# Patient Record
Sex: Female | Born: 1945
Health system: Southern US, Community
[De-identification: ages and names within clinical notes are randomized; demographics above are authoritative.]

## PROBLEM LIST (undated history)

## (undated) DIAGNOSIS — L409 Psoriasis, unspecified: Secondary | ICD-10-CM

## (undated) DIAGNOSIS — I471 Supraventricular tachycardia, unspecified: Secondary | ICD-10-CM

## (undated) DIAGNOSIS — A4901 Methicillin susceptible Staphylococcus aureus infection, unspecified site: Secondary | ICD-10-CM

## (undated) DIAGNOSIS — R945 Abnormal results of liver function studies: Secondary | ICD-10-CM

## (undated) DIAGNOSIS — I10 Essential (primary) hypertension: Secondary | ICD-10-CM

## (undated) DIAGNOSIS — K219 Gastro-esophageal reflux disease without esophagitis: Secondary | ICD-10-CM

## (undated) DIAGNOSIS — M069 Rheumatoid arthritis, unspecified: Secondary | ICD-10-CM

## (undated) DIAGNOSIS — R7989 Other specified abnormal findings of blood chemistry: Secondary | ICD-10-CM

## (undated) DIAGNOSIS — M8580 Other specified disorders of bone density and structure, unspecified site: Secondary | ICD-10-CM

## (undated) DIAGNOSIS — IMO0002 Reserved for concepts with insufficient information to code with codable children: Secondary | ICD-10-CM

## (undated) DIAGNOSIS — K760 Fatty (change of) liver, not elsewhere classified: Secondary | ICD-10-CM

## (undated) DIAGNOSIS — E669 Obesity, unspecified: Secondary | ICD-10-CM

## (undated) DIAGNOSIS — I493 Ventricular premature depolarization: Secondary | ICD-10-CM

## (undated) DIAGNOSIS — R002 Palpitations: Secondary | ICD-10-CM

## (undated) DIAGNOSIS — K759 Inflammatory liver disease, unspecified: Secondary | ICD-10-CM

## (undated) HISTORY — DX: Gastro-esophageal reflux disease without esophagitis: K21.9

## (undated) HISTORY — DX: Inflammatory liver disease, unspecified: K75.9

## (undated) HISTORY — DX: Abnormal results of liver function studies: R94.5

## (undated) HISTORY — DX: Essential (primary) hypertension: I10

## (undated) HISTORY — DX: Supraventricular tachycardia, unspecified: I47.10

## (undated) HISTORY — PX: FACIAL RECONSTRUCTION SURGERY: SHX631

## (undated) HISTORY — DX: Reserved for concepts with insufficient information to code with codable children: IMO0002

## (undated) HISTORY — DX: Psoriasis, unspecified: L40.9

## (undated) HISTORY — DX: Palpitations: R00.2

## (undated) HISTORY — DX: Methicillin susceptible Staphylococcus aureus infection, unspecified site: A49.01

## (undated) HISTORY — DX: Ventricular premature depolarization: I49.3

## (undated) HISTORY — DX: Rheumatoid arthritis, unspecified: M06.9

## (undated) HISTORY — DX: Obesity, unspecified: E66.9

## (undated) HISTORY — DX: Nonrheumatic aortic (valve) stenosis: I35.0

## (undated) HISTORY — DX: Fatty (change of) liver, not elsewhere classified: K76.0

## (undated) HISTORY — DX: Other specified disorders of bone density and structure, unspecified site: M85.80

## (undated) HISTORY — DX: Other specified abnormal findings of blood chemistry: R79.89

## (undated) HISTORY — DX: Supraventricular tachycardia: I47.1

## (undated) HISTORY — DX: Atherosclerotic heart disease of native coronary artery without angina pectoris: I25.10

---

## 1976-10-11 HISTORY — PX: OVARIAN CYST REMOVAL: SHX89

## 1996-10-11 HISTORY — PX: APPENDECTOMY: SHX54

## 1998-12-09 ENCOUNTER — Encounter: Payer: Self-pay | Admitting: Internal Medicine

## 1998-12-09 ENCOUNTER — Ambulatory Visit (HOSPITAL_COMMUNITY): Admission: RE | Admit: 1998-12-09 | Discharge: 1998-12-09 | Payer: Self-pay | Admitting: Internal Medicine

## 1999-01-07 ENCOUNTER — Other Ambulatory Visit: Admission: RE | Admit: 1999-01-07 | Discharge: 1999-01-07 | Payer: Self-pay | Admitting: *Deleted

## 1999-07-28 ENCOUNTER — Other Ambulatory Visit: Admission: RE | Admit: 1999-07-28 | Discharge: 1999-07-28 | Payer: Self-pay | Admitting: *Deleted

## 2000-08-23 ENCOUNTER — Other Ambulatory Visit: Admission: RE | Admit: 2000-08-23 | Discharge: 2000-08-23 | Payer: Self-pay | Admitting: *Deleted

## 2001-10-11 HISTORY — PX: HERNIA REPAIR: SHX51

## 2001-10-11 HISTORY — PX: OTHER SURGICAL HISTORY: SHX169

## 2004-10-19 ENCOUNTER — Other Ambulatory Visit: Admission: RE | Admit: 2004-10-19 | Discharge: 2004-10-19 | Payer: Self-pay | Admitting: Family Medicine

## 2005-10-11 DIAGNOSIS — A4901 Methicillin susceptible Staphylococcus aureus infection, unspecified site: Secondary | ICD-10-CM

## 2005-10-11 HISTORY — DX: Methicillin susceptible Staphylococcus aureus infection, unspecified site: A49.01

## 2008-07-04 ENCOUNTER — Encounter: Payer: Self-pay | Admitting: Women's Health

## 2008-07-04 ENCOUNTER — Other Ambulatory Visit: Admission: RE | Admit: 2008-07-04 | Discharge: 2008-07-04 | Payer: Self-pay | Admitting: Gynecology

## 2011-05-06 ENCOUNTER — Encounter: Payer: Self-pay | Admitting: *Deleted

## 2011-05-07 ENCOUNTER — Other Ambulatory Visit (HOSPITAL_COMMUNITY)
Admission: RE | Admit: 2011-05-07 | Discharge: 2011-05-07 | Disposition: A | Discharge status: Home / Self Care | Payer: Medicare Other | Source: Ambulatory Visit | Attending: Women's Health | Admitting: Women's Health

## 2011-05-07 ENCOUNTER — Ambulatory Visit (INDEPENDENT_AMBULATORY_CARE_PROVIDER_SITE_OTHER): Payer: Medicare Other | Admitting: Women's Health

## 2011-05-07 ENCOUNTER — Encounter: Payer: Self-pay | Admitting: Women's Health

## 2011-05-07 VITALS — BP 140/90 | Ht 64.0 in | Wt 193.0 lb

## 2011-05-07 DIAGNOSIS — Z01419 Encounter for gynecological examination (general) (routine) without abnormal findings: Secondary | ICD-10-CM

## 2011-05-07 DIAGNOSIS — Z124 Encounter for screening for malignant neoplasm of cervix: Secondary | ICD-10-CM

## 2011-05-07 NOTE — Progress Notes (Signed)
Madison Gutierrez 06/23/1946 [id number]    History:    The patient presents for annual exam. 65 year old MWF G4 P3 postmenopausal with no bleeding. No HRT. Problem with psoriasis which has caused problems with exercise,and has had wt gain. She is hypertensive and has her labs and meds done at primary care.   Past medical history, past surgical history, family history and social history were all reviewed and documented in the EPIC chart.   ROS:  A 14 point ROS was performed and pertinent positives and negatives are included in the history.  Exam:  Filed Vitals:   05/07/11 1108  BP: 140/90    General appearance:  Normal Head/Neck:  Normal, without cervical or supraclavicular adenopathy. Thyroid:  Symmetrical, normal in size, without palpable masses or nodularity. Respiratory  Effort:  Normal  Auscultation:  Clear without wheezing or rhonchi Cardiovascular  Auscultation:  Regular rate, without rubs, murmurs or gallops  Edema/varicosities:  Not grossly evident Abdominal  Masses/tenderness:  Soft,nontender, without masses, guarding or rebound.  Liver/spleen:  No organomegaly noted  Hernia:  None appreciated  Occult test:   Skin  Inspection:  Grossly normal  Palpation:  Grossly normal Neurologic/psychiatric  Orientation:  Normal with appropriate conversation.  Mood/affect:  Normal  Genitourinary     Breasts: Examined lying and sitting.     Right: Without masses, retractions, discharge or axillary adenopathy.     Left: Without masses, retractions, discharge or axillary adenopathy.   Inguinal/mons:  Normal without inguinal adenopathy  External genitalia:  Normal  BUS/Urethra/Skene's glands:  Normal  Bladder:  Normal  Vagina:  Normal  Cervix:  Normal  Uterus:  normal in size, shape and contour.  Midline and mobile  Adnexa/parametria:     Rt: Without masses or tenderness.   Lt: Without masses or tenderness.  Anus and perineum: Normal  Digital rectal exam: Normal sphincter  tone without palpated masses or  tenderness.   Assessment/Plan:  65 y.o. year old female for annual exam.Pap Only today. Reviewed needed vaccines of zostavac, Pneumovax, , will get at [facility]. She is overdue for mammogram. She will get that scheduled she has a history of normal mammograms in the past,SBE's encouraged. She has not had a bone density in years we'll get that scheduled here at our office. Reviewed importance of the calcium rich diet, vitamin D 2000 daily encouraged. She has never had a colonoscopy and reviewed importance of a screening colonoscopy she states she is in the process of getting that  Scheduled.  He has problems with psoriasis of her feet causing some mobility problems, she does have plans to start using a stationary bike.Home safety, fall prevention discussed.     Harrington Challenger MD, 12:04 PM 05/07/2011

## 2011-05-07 NOTE — Patient Instructions (Signed)
Schedule mammogram  And have yearly Bone density here Colonoscopy  Schedule Tdap, zostovac (shingles), pnuemonia  Vaccine  Exercise - Walk daily

## 2011-05-12 DIAGNOSIS — M8580 Other specified disorders of bone density and structure, unspecified site: Secondary | ICD-10-CM

## 2011-05-12 HISTORY — DX: Other specified disorders of bone density and structure, unspecified site: M85.80

## 2011-05-19 DIAGNOSIS — R7989 Other specified abnormal findings of blood chemistry: Secondary | ICD-10-CM

## 2011-05-19 HISTORY — DX: Other specified abnormal findings of blood chemistry: R79.89

## 2011-05-20 ENCOUNTER — Encounter: Payer: Self-pay | Admitting: Gynecology

## 2011-05-20 ENCOUNTER — Ambulatory Visit (INDEPENDENT_AMBULATORY_CARE_PROVIDER_SITE_OTHER): Payer: Medicare Other | Admitting: Gynecology

## 2011-05-20 ENCOUNTER — Telehealth: Payer: Self-pay | Admitting: Gynecology

## 2011-05-20 DIAGNOSIS — M899 Disorder of bone, unspecified: Secondary | ICD-10-CM

## 2011-05-20 DIAGNOSIS — Z01419 Encounter for gynecological examination (general) (routine) without abnormal findings: Secondary | ICD-10-CM

## 2011-05-20 DIAGNOSIS — M8580 Other specified disorders of bone density and structure, unspecified site: Secondary | ICD-10-CM

## 2011-05-20 NOTE — Telephone Encounter (Signed)
LM FOR PT TO CALL RE:BELOW NOTE. 

## 2011-05-20 NOTE — Telephone Encounter (Signed)
Call patient told her bone density did show osteopenia. I want to check a vitamin D level and I put this in and then have her make an appointment with me to discuss the results.

## 2011-05-21 NOTE — Telephone Encounter (Signed)
PT INFORMED WITH THE BELOW NOTE. 

## 2011-05-24 ENCOUNTER — Other Ambulatory Visit: Payer: Medicare Other | Admitting: Gynecology

## 2011-05-24 DIAGNOSIS — K760 Fatty (change of) liver, not elsewhere classified: Secondary | ICD-10-CM

## 2011-05-24 DIAGNOSIS — M8580 Other specified disorders of bone density and structure, unspecified site: Secondary | ICD-10-CM

## 2011-05-24 HISTORY — DX: Fatty (change of) liver, not elsewhere classified: K76.0

## 2011-05-25 ENCOUNTER — Telehealth: Payer: Self-pay | Admitting: *Deleted

## 2011-05-25 NOTE — Telephone Encounter (Signed)
CALLED PT TO REMIND PT TO SET APPOINTMENT TO DISCUSS LAB & DEXA REPORT WITH DR TF. PT STATES SHE WILL CALL BACK TO SCHEDULE.

## 2011-06-07 ENCOUNTER — Encounter: Payer: Self-pay | Admitting: Cardiovascular Disease

## 2011-06-07 ENCOUNTER — Telehealth: Payer: Self-pay | Admitting: *Deleted

## 2011-06-07 NOTE — Telephone Encounter (Signed)
PT CALLED TO GET VIT. D RESULTS THEY WERE GIVEN TO PATIENT. SHE THEN STATES REGARDING THE FOLLOW APPOINTMENT(FOR BONE LOSS) SHE DOES 'NT WANT TO COME BACK IN THE OFFICE JUST TO DISCUSS LABS AND TREATMENT. PT STATES [place] AND WANTS TO KNOW IF SHE COULD POSSIBLY TALK TO YOU ON THE PHONE. I TOLD PT I WAS NOT SURE IF YOU WERE ABLE TO DO THIS. PLEASE ADVISE.

## 2011-06-07 NOTE — Telephone Encounter (Signed)
PT INFORMED WITH THE BELOW NOTE, SHE WILL MAKE APPOINTMENT.

## 2011-06-07 NOTE — Telephone Encounter (Signed)
Appointment is to discuss whether we want to treat her with medication or not and I think this is the better discussion to have face to face.

## 2011-06-08 ENCOUNTER — Encounter: Payer: Self-pay | Admitting: Cardiovascular Disease

## 2011-06-08 ENCOUNTER — Ambulatory Visit (INDEPENDENT_AMBULATORY_CARE_PROVIDER_SITE_OTHER): Payer: Medicare Other | Admitting: Cardiovascular Disease

## 2011-06-08 VITALS — BP 152/98 | HR 96 | Ht 64.0 in | Wt 194.8 lb

## 2011-06-08 DIAGNOSIS — R002 Palpitations: Secondary | ICD-10-CM

## 2011-06-08 NOTE — Progress Notes (Signed)
History of Present Illness:Madison Gutierrez with h/o HTN here today for further evaluation of palpitations. She was seen by Dr. Collins Scotland on 05/19/11 and EKG with frequent PVCs. She has a previous history of palpitations and had a normal stress test 6-7 years. She has no prior documented cardiac disease. She reports weight gain and recent worsening dyspnea that she believes is from her increased weight. She denies any feelings of her heart racing. She does notice her pulse skipping beats. This does wake her up at night. This occurs almost every day. No related near syncope or syncope. No chest pain.   Past Medical History  Diagnosis Date  . Staph aureus infection 2007    rt. shin  . Hypertension   . Hepatitis     as child, pt unsure of type  . Rheumatoid arthritis   . Osteopenia 05/2011    t score -1.9    Past Surgical History  Procedure Date  . Hernia repair     bi-lat  repair 2003  . Appendectomy     1998  . Tummy tuck     2003  . Ovarian cyst removal     from left ovary 1978    Current Outpatient Prescriptions  Medication Sig Dispense Refill  . aspirin 325 MG tablet Take 325 mg by mouth daily.        . B Complex-C (SUPER B COMPLEX PO) Take by mouth.        Marland Kitchen CALCIUM PO Take by mouth.        . Cholecalciferol (VITAMIN D PO) Take by mouth.        . Coenzyme Q10 (CO Q-10 PO) Take by mouth.        . Cyanocobalamin (VITAMIN B-12 PO) Take by mouth.        Marland Kitchen lisinopril (PRINIVIL,ZESTRIL) 5 MG tablet Take 5 mg by mouth daily.        . magnesium oxide (MAG-OX) 400 MG tablet Take 400 mg by mouth daily.        . milk thistle 175 MG tablet Take 250 mg by mouth daily.       . Multiple Vitamin (MULTIVITAMIN PO) Take by mouth.        . NON FORMULARY Take 1 tablet by mouth daily. Livedetox       . Omega-3 Fatty Acids (FISH OIL PO) Take by mouth.          Allergies  Allergen Reactions  . Dairy Aid (Lactase)   . Sulfa Antibiotics Rash    History   Social History  . Marital Status: Married      Spouse Name: N/A    Number of Children: N/A  . Years of Education: N/A   Occupational History  . Not on file.   Social History Main Topics  . Smoking status: Never Smoker   . Smokeless tobacco: Never Used  . Alcohol Use: 0.6 oz/week    1 Glasses of wine per week  . Drug Use: No  . Sexually Active: Not Currently -- Gutierrez partner(s)    Birth Control/ Protection: Post-menopausal   Other Topics Concern  . Not on file   Social History Narrative  . No narrative on file    Family History  Problem Relation Age of Onset  . Hypertension Mother   . Heart disease Mother   . Hypertension Father   . Heart disease Father   . Hypertension Brother   . Cancer Brother     PROSTATE  .  Hypertension Maternal Grandmother   . Heart attack Mother   . Heart attack Father     Review of Systems:  As stated in the HPI and otherwise negative.   BP 152/98  Pulse 96  Ht 5\' 4"  (1.626 m)  Wt 194 lb 12.8 oz (88.361 kg)  BMI 33.44 kg/m2  Physical Examination: General: Well developed, well nourished, NAD HEENT: OP clear, mucus membranes moist SKIN: warm, dry. No rashes. Neuro: No focal deficits Musculoskeletal: Muscle strength 5/5 all ext Psychiatric: Mood and affect normal Neck: No JVD, no carotid bruits, no thyromegaly, no lymphadenopathy. Lungs:Clear bilaterally, no wheezes, rhonci, crackles Cardiovascular: Regular rate and rhythm. No murmurs, gallops or rubs. Abdomen:Soft. Bowel sounds present. Non-tender.  Extremities: No lower extremity edema. Pulses are 2 + in the bilateral DP/PT.

## 2011-06-08 NOTE — Assessment & Plan Note (Signed)
Most likely from PVCs. Will get echo to exclude structural heart disease and evaluate LV function. Will have her wear a 48 hour Holter monitor.

## 2011-06-08 NOTE — Patient Instructions (Signed)
Your physician recommends that you schedule a follow-up appointment in: 3-4 weeks with [facility]  Your physician has requested that you have an echocardiogram. Echocardiography is a painless test that uses sound waves to create images of your heart. It provides your doctor with information about the size and shape of your heart and how well your heart's chambers and valves are working. This procedure takes approximately one hour. There are no restrictions for this procedure.  Your physician has recommended that you wear a 48 hour holter monitor. Holter monitors are medical devices that record the heart's electrical activity. Doctors most often use these monitors to diagnose arrhythmias. Arrhythmias are problems with the speed or rhythm of the heartbeat. The monitor is a small, portable device. You can wear one while you do your normal daily activities. This is usually used to diagnose what is causing palpitations/syncope (passing out).

## 2011-06-09 ENCOUNTER — Encounter: Payer: Self-pay | Admitting: Gynecology

## 2011-06-09 ENCOUNTER — Ambulatory Visit (INDEPENDENT_AMBULATORY_CARE_PROVIDER_SITE_OTHER): Payer: Medicare Other | Admitting: Gynecology

## 2011-06-09 DIAGNOSIS — M8580 Other specified disorders of bone density and structure, unspecified site: Secondary | ICD-10-CM

## 2011-06-09 DIAGNOSIS — M899 Disorder of bone, unspecified: Secondary | ICD-10-CM

## 2011-06-09 DIAGNOSIS — R002 Palpitations: Secondary | ICD-10-CM

## 2011-06-09 DIAGNOSIS — M949 Disorder of cartilage, unspecified: Secondary | ICD-10-CM

## 2011-06-09 LAB — T4: T4, Total: 7.2 ug/dL (ref 5.0–12.5)

## 2011-06-09 LAB — TSH: TSH: 1.136 u[IU]/mL (ref 0.350–4.500)

## 2011-06-09 LAB — T3 UPTAKE: T3 Uptake: 30 % (ref 22.5–37.0)

## 2011-06-09 NOTE — Progress Notes (Signed)
Patient presents to discuss her recent DEXA study showing low bone mass. She also had a vitamin D level which returned 32. DEXA shows T score -1.9 at the AP spine -1.7 at the left femoral neck. Her FRAX showed a 10 year probability of fracture major 14% and hip fracture at 2.5%. I reviewed her situation to include an NOF and ISCD recommendations.  Modifiable activities were also reviewed with her such as increasing exercise decreasing alcohol maintaining calcium and vitamin D intake. After a lengthy discussion we both feel comfortable with no pharmacologic treatment at this time. We will repeat the DEXA in 2 years and then discuss possible treatment at that time. She understands that there could be progressive bone loss and accepts this.

## 2011-06-18 ENCOUNTER — Encounter (INDEPENDENT_AMBULATORY_CARE_PROVIDER_SITE_OTHER): Payer: Medicare Other

## 2011-06-18 ENCOUNTER — Ambulatory Visit (HOSPITAL_COMMUNITY): Payer: Medicare Other | Attending: Cardiovascular Disease | Admitting: Radiology

## 2011-06-18 DIAGNOSIS — R002 Palpitations: Secondary | ICD-10-CM

## 2011-06-18 DIAGNOSIS — I359 Nonrheumatic aortic valve disorder, unspecified: Secondary | ICD-10-CM | POA: Insufficient documentation

## 2011-06-18 DIAGNOSIS — R0989 Other specified symptoms and signs involving the circulatory and respiratory systems: Secondary | ICD-10-CM | POA: Insufficient documentation

## 2011-06-18 DIAGNOSIS — R0609 Other forms of dyspnea: Secondary | ICD-10-CM | POA: Insufficient documentation

## 2011-06-18 DIAGNOSIS — E669 Obesity, unspecified: Secondary | ICD-10-CM | POA: Insufficient documentation

## 2011-06-18 DIAGNOSIS — I10 Essential (primary) hypertension: Secondary | ICD-10-CM | POA: Insufficient documentation

## 2011-06-21 ENCOUNTER — Encounter: Payer: Self-pay | Admitting: Internal Medicine

## 2011-06-21 ENCOUNTER — Ambulatory Visit: Payer: Medicare Other

## 2011-06-21 ENCOUNTER — Ambulatory Visit (INDEPENDENT_AMBULATORY_CARE_PROVIDER_SITE_OTHER): Payer: Medicare Other | Admitting: Internal Medicine

## 2011-06-21 DIAGNOSIS — R7989 Other specified abnormal findings of blood chemistry: Secondary | ICD-10-CM

## 2011-06-21 DIAGNOSIS — L409 Psoriasis, unspecified: Secondary | ICD-10-CM | POA: Insufficient documentation

## 2011-06-21 DIAGNOSIS — K529 Noninfective gastroenteritis and colitis, unspecified: Secondary | ICD-10-CM | POA: Insufficient documentation

## 2011-06-21 DIAGNOSIS — R748 Abnormal levels of other serum enzymes: Secondary | ICD-10-CM | POA: Insufficient documentation

## 2011-06-21 DIAGNOSIS — I10 Essential (primary) hypertension: Secondary | ICD-10-CM | POA: Insufficient documentation

## 2011-06-21 DIAGNOSIS — Z1211 Encounter for screening for malignant neoplasm of colon: Secondary | ICD-10-CM

## 2011-06-21 DIAGNOSIS — R197 Diarrhea, unspecified: Secondary | ICD-10-CM

## 2011-06-21 LAB — CBC WITH DIFFERENTIAL/PLATELET
Basophils Relative: 0.8 % (ref 0.0–3.0)
Eosinophils Relative: 2.3 % (ref 0.0–5.0)
HCT: 45.4 % (ref 36.0–46.0)
Lymphs Abs: 1.8 10*3/uL (ref 0.7–4.0)
MCV: 93.4 fl (ref 78.0–100.0)
Monocytes Absolute: 0.5 10*3/uL (ref 0.1–1.0)
RBC: 4.86 Mil/uL (ref 3.87–5.11)
WBC: 7 10*3/uL (ref 4.5–10.5)

## 2011-06-21 LAB — IBC PANEL
Iron: 115 ug/dL (ref 42–145)
Saturation Ratios: 26.1 % (ref 20.0–50.0)
Transferrin: 314.7 mg/dL (ref 212.0–360.0)

## 2011-06-21 LAB — BASIC METABOLIC PANEL
Calcium: 9 mg/dL (ref 8.4–10.5)
Creatinine, Ser: 0.6 mg/dL (ref 0.4–1.2)
GFR: 108.36 mL/min (ref >60.00)
Glucose, Bld: 92 mg/dL (ref 70–99)
Sodium: 140 mEq/L (ref 135–145)

## 2011-06-21 LAB — HEPATIC FUNCTION PANEL
ALT: 295 U/L — ABNORMAL HIGH (ref 0–35)
Bilirubin, Direct: 0.1 mg/dL (ref 0.0–0.3)
Total Protein: 7.4 g/dL (ref 6.0–8.3)

## 2011-06-21 LAB — IGA: IgA: 286 mg/dL (ref 68–378)

## 2011-06-21 MED ORDER — PEG-KCL-NACL-NASULF-NA ASC-C 100 G PO SOLR: 1.0000 | Freq: Once | ORAL | Status: DC

## 2011-06-21 NOTE — Progress Notes (Signed)
Subjective:    Patient ID: Madison Gutierrez, female    DOB: 04-24-1946, 65 y.o.   MRN: [id number]  HPI Madison Gutierrez is a 65 year old female with a past medical history of rheumatoid arthritis, psoriasis, hypertension, and mild asthma who is seen in consultation at the request of Dr. Collins Scotland for evaluation of elevated liver function tests and diarrhea.  The patient present alone today.  She reports that she is here for "intestinal problems".  She reports that the last 18 months she has experienced loose stools and occasionally "violent diarrhea".  This is not associated with abdominal pain. She's also had no rectal bleeding or melena. She does not associate it with anything specifically that she eats. She does report significant gas and bloating. She denies nausea and vomiting. Heartburn is only very rarely a problem for her. She denies odynophagia and dysphagia. She reports her appetite is very good and she notes a weight gain of approximately 50 pounds in 2 years. She reports that she tries to diet but this is difficult for her.  Regarding her elevated liver function tests. She relates this to "fatty liver". She denies a history of jaundice, ascites, significant lower extremity edema, itching, confusion. No prior history of tattoo. She reports a long-term monogamous relationship. No prior history of blood transfusion.  She reports being told after her appendectomy that her liver was "big".   Review of Systems Constitutional: Negative for fever, chills, night sweats, activity change, appetite change  HEENT: Negative for sore throat, mouth sores and trouble swallowing. Eyes: Negative for visual disturbance Respiratory: Negative for cough, chest tightness and shortness of breath Cardiovascular: Negative for chest pain and lower extremity swelling, + palpitations (recently wore Holter) Gastrointestinal: See history of present illness Genitourinary: Negative for dysuria and hematuria. Musculoskeletal: Negative  for back pain, arthralgias and myalgias Skin: No jaundice, chronic psoriatic rash hands feet bilateral Neurological: Negative for headaches, weakness, numbness Hematological: Negative for adenopathy, negative for easy bruising/bleeding Psychiatric/behavioral: Negative for depressed mood, negative for anxiety  Past Medical History  Diagnosis Date  . Staph aureus infection 2007    rt. shin  . Hypertension   . Rheumatoid arthritis   . Osteopenia 05/2011    t score -1.9  . Elevated LFTs 05/19/11  . Heart palpitations   . Hepatic steatosis 05/24/11  . Psoriasis   . Obesity   . Hepatitis, unspecified    Past Surgical History  Procedure Date  . Hernia repair 2003    bi-lat  repair   . Appendectomy 1998  . Tummy tuck 2003  . Ovarian cyst removal 1978    from left ovary   . Facial reconstruction surgery    Current outpatient prescriptions:AMBULATORY NON FORMULARY MEDICATION, Medication Name: mega red 300mg  take one tablet by mouth once a day , Disp: , Rfl: ;  aspirin 325 MG tablet, Take 325 mg by mouth 2 (two) times daily.  , Disp: , Rfl: ;  B Complex-C (SUPER B COMPLEX PO), Take 1 capsule by mouth daily. , Disp: , Rfl: ;  CALCIUM PO, Take 1 capsule by mouth 2 (two) times daily. , Disp: , Rfl:  Cholecalciferol (VITAMIN D PO), Take 1 capsule by mouth daily. , Disp: , Rfl: ;  Coenzyme Q10 (CO Q-10 PO), Take 1 capsule by mouth daily. , Disp: , Rfl: ;  Cyanocobalamin (VITAMIN B-12 PO), Take 1 capsule by mouth daily. , Disp: , Rfl: ;  lisinopril (PRINIVIL,ZESTRIL) 5 MG tablet, Take 5 mg by mouth daily.  ,  Disp: , Rfl: ;  milk thistle 175 MG tablet, Take 250 mg by mouth daily. , Disp: , Rfl:  Multiple Vitamin (MULTIVITAMIN PO), Take 1 capsule by mouth daily. , Disp: , Rfl: ;  NON FORMULARY, Take 1 tablet by mouth daily. Livedetox , Disp: , Rfl: ;  Omega-3 Fatty Acids (FISH OIL PO), Take by mouth.  , Disp: , Rfl: ;  peg 3350 powder (MOVIPREP) 100 G SOLR, Take 1 kit (100 g total) by mouth once., Disp: 1  kit, Rfl: 0  Allergies  Allergen Reactions  . Dairy Aid (Lactase)   . Sulfa Antibiotics Rash   Family History  Problem Relation Age of Onset  . Hypertension Mother   . Heart disease Mother   . Hypertension Father   . Heart disease Father   . Hypertension Brother   . Prostate cancer Brother   . Hypertension Maternal Grandmother   . Heart attack Mother   . Heart attack Father   . Colon cancer Neg Hx   --no known history of chronic liver disease. No known history of colon polyps. No known history of IBD   Social History  . Marital Status: Married    Spouse Name: N/A    Number of Children: 3   Occupational History  . retired    Social History Main Topics  . Smoking status: Never Smoker   . Smokeless tobacco: Never Used  . Alcohol Use: 8.4 oz/week    14 Glasses of wine per week     at least two glasses of wine sometimes more nightly, (4 glasses/night at most)  . Drug Use: No  . Sexually Active: Not Currently -- husband    Birth Control/ Protection: Post-menopausal      Objective:   Physical Exam BP 140/82  Pulse 78  Ht 5\' 4"  (1.626 m)  Wt 194 lb 6.4 oz (88.179 kg)  BMI 33.37 kg/m2 Constitutional: Well-developed and well-nourished. No distress. HEENT: Normocephalic and atraumatic. Oropharynx is clear and moist. No oropharyngeal exudate. Conjunctivae are normal. Pupils are equal round and reactive to light. No scleral icterus. Neck: Neck supple. Trachea midline. Cardiovascular: Normal rate, regular rhythm and intact distal pulses. No M/R/G Pulmonary/chest: Effort normal and breath sounds normal. No wheezing, rales or rhonchi. Abdominal: Soft, nontender, nondistended. Obese, Bowel sounds active throughout. There are no masses palpable. No hepatosplenomegaly. Lymphadenopathy: No cervical adenopathy noted. Neurological: Alert and oriented to person place and time. Skin: Skin is warm and dry.  Desquamation and skin erythema noted in the bilateral palms the peri-ungual  areas and bilateral soles of the feet, consistent with psoriasis. Psychiatric: Normal mood and affect. Behavior is normal.  Labs 05/20/2011 Hepatitis B. surface Ag negative, hepatitis B core Ab IgM negative, hepatitis A IgM negative, hepatitis C antibody WBC 5.2, hemoglobin 14.8, hematocrit 48.5, MCV 93.3, platelet 216 Sodium 141, potassium 4.3, chloride 105, CO2 28, BUN 12, creatinine 0.59, glucose 92 Total bili 0.5, alk phosphatase 100, AST 204, ALT 173 Total protein 6.6, albumin 3.8, calcium 9.0 TSH 2.133 Vitamin D (25-H) 34  Imaging, 05/24/2011 Abdominal ultrasound: Diffuse increased hepatic echogenicity. Most, this is due to hepatic steatosis/fat infiltration. No focal hepatic nodules or biliary ductal dilatation. CBD 5 mm. Gallbladder normal without cholelithiasis, wall thickening, or pericholecystic fluid collections. The pancreas, kidneys, and spleen unremarkable. Mild non-dilatory aortic plaque. No free fluid.     Assessment & Plan:   65 year old female with a PMH of rheumatoid arthritis, psoriasis, hypertension, and mild asthma who is seen in consultation  at the request of Dr. Collins Scotland for evaluation of transaminitis and diarrhea.  1. Transaminitis -  The patient's to primary risk factors for transaminitis/hepatitis are fatty liver and alcohol intake.  The 2 may not be mutually exclusive.  Her transaminases were approximate 7 times the upper limit of normal, which is usually higher than would be expected for fatty liver alone.  She also does not have the classic ratio for alcoholic hepatitis.  Her ultrasound showed normal blood flow to and from the liver.  I will order additional tests today to further evaluate her transaminitis to include, repeat hepatic function panel, PT/PTT, ANA, IgG, iron studies, ANA, ASMA, and ceruloplasmin. I also will check her immune status to hepatitis A and B., if she is not immune then we will recommend vaccination.  We did discuss her alcohol intake and I  have advised that she try to reduce her daily alcohol intake with the goal of abstinence. I would like to see if her hepatic panel normalizes with the cessation of alcohol. She is willing to give this a try. I also have reviewed her medications, and there are none that specifically concerned me for hepatitis. However I have advised that she call me should she decide to take any additional OTC nutritional supplement.  She voiced understanding. We also briefly discussed that if her hepatic panel fails to normalize with cessation of alcohol and nothing is revealed in the lab studies ordered today, we might consider liver biopsy.  2. Diarrhea - her diarrhea seems to be more episodic in nature, and is chronic. There are no other alarm signs. She has never had a colonoscopy and this will be scheduled for today. If her colon is macroscopically normal, we will perform random biopsies to exclude microscopic colitis.  I have advised that she stop her OTC Mag-Ox as this may be contributing to her loose stools.  Followup will be based on the results of her lab tests and the colonoscopy.

## 2011-06-21 NOTE — Patient Instructions (Signed)
Please go to the laboratory in the basement today for ordered lab work Stop taking Magnesium oxide Before starting any new over the counter supplements please check with Dr Aggie Hacker have been scheduled for a colonoscopy.   Please see attached instructions

## 2011-06-22 LAB — CERULOPLASMIN: Ceruloplasmin: 32 mg/dL (ref 20–60)

## 2011-06-22 LAB — IGG: IgG (Immunoglobin G), Serum: 1270 mg/dL (ref 690–1700)

## 2011-06-22 LAB — ALPHA-1-ANTITRYPSIN: A-1 Antitrypsin, Ser: 173 mg/dL (ref 90–200)

## 2011-06-22 LAB — HEPATITIS A ANTIBODY, TOTAL: Hep A Total Ab: POSITIVE — AB

## 2011-06-23 LAB — ANTI-SMOOTH MUSCLE ANTIBODY, IGG: Smooth Muscle Ab: 10 U (ref <20)

## 2011-06-28 ENCOUNTER — Telehealth: Payer: Self-pay | Admitting: *Deleted

## 2011-06-28 DIAGNOSIS — R7989 Other specified abnormal findings of blood chemistry: Secondary | ICD-10-CM

## 2011-06-28 DIAGNOSIS — R748 Abnormal levels of other serum enzymes: Secondary | ICD-10-CM

## 2011-06-28 NOTE — Telephone Encounter (Signed)
Message copied by Florene Glen on Mon Jun 28, 2011  4:37 PM ------      Message from: Beverley Fiedler      Created: Mon Jun 28, 2011 10:00 AM       Aram Beecham, please contact this patient and let her other results of her recent lab workup for elevated LFTs      Her AST and ALT remain markedly abnormal, and her ferritin (could be ETOH related, but unknown) is also elevated.      She uses alcohol significantly which we discussed in clinic, and I advised her to stop.      I would like to repeat her labs in one month after alcohol cessation to see if they improve. If they do not we will need to proceed with liver biopsy.            Please schedule her for labs in 3-4 weeks, assuming alcohol abstinence. Remind her, as I did in clinic, how important it is that she remain alcohol free so that I can better understand her possible liver disease      Please order repeat hepatic function panel, INR in 3-4 weeks.  Also order genetic testing for hemochromatosis (HFE gene analysis).      Finally she is to start hepatitis B vaccine series.      Thank you.            Clinic followup will be based on followup lab testing and possible liver biopsy

## 2011-07-01 ENCOUNTER — Ambulatory Visit (AMBULATORY_SURGERY_CENTER): Payer: Medicare Other | Admitting: Internal Medicine

## 2011-07-01 ENCOUNTER — Encounter: Payer: Self-pay | Admitting: Internal Medicine

## 2011-07-01 ENCOUNTER — Encounter: Payer: Self-pay | Admitting: *Deleted

## 2011-07-01 VITALS — BP 167/97 | HR 86 | Temp 98.9°F | Resp 20 | Ht 64.0 in | Wt 194.0 lb

## 2011-07-01 DIAGNOSIS — D126 Benign neoplasm of colon, unspecified: Secondary | ICD-10-CM

## 2011-07-01 DIAGNOSIS — K5289 Other specified noninfective gastroenteritis and colitis: Secondary | ICD-10-CM

## 2011-07-01 DIAGNOSIS — Z1211 Encounter for screening for malignant neoplasm of colon: Secondary | ICD-10-CM

## 2011-07-01 DIAGNOSIS — R197 Diarrhea, unspecified: Secondary | ICD-10-CM

## 2011-07-01 MED ORDER — SODIUM CHLORIDE 0.9 % IV SOLN: 500.0000 mL | INTRAVENOUS | Status: DC

## 2011-07-01 NOTE — Telephone Encounter (Signed)
Spoke with pt this am in the admitting/PRE OP area in [facility]. Informed her AST and ALT liver enzymes were elevated as was her FERRITIN. Dr Rhea Belton states the elevation could be d/t ETOH use. Pt reports these labs are always up and she can usually control them with her weight. Informed her Dr Rhea Belton would like these repeated in 3-4 weeks while abstaining from alcohol. She will also need a test to r/o hemochromatosis and explained this was d/t her elevated and that hemo. can  cause organ damage . Explained the need for Hep. B vaccines and I will schule that and her labs. I will bring the appointments to her today; pt stated understanding.

## 2011-07-01 NOTE — Progress Notes (Signed)
Pt passed large amt of air while in recovery room

## 2011-07-01 NOTE — Patient Instructions (Addendum)
Please review discharge instructions (blue and green sheets)  Please review information on polyps, hemorrhoids, diverticulosis and high fiber diets  Resume your medications  Await pathology

## 2011-07-01 NOTE — Telephone Encounter (Signed)
See letter written today and given to pt in [facility] recovery. Letter listed appt dates for her HEP B injections and he lab appts. Letter was discussed with pt and given to her daughter because pt had conscious sedation for her procedure today.

## 2011-07-02 ENCOUNTER — Telehealth: Payer: Self-pay

## 2011-07-02 NOTE — Telephone Encounter (Signed)

## 2011-07-05 ENCOUNTER — Telehealth: Payer: Self-pay | Admitting: Internal Medicine

## 2011-07-05 ENCOUNTER — Encounter: Payer: Self-pay | Admitting: *Deleted

## 2011-07-05 NOTE — Telephone Encounter (Signed)
Spoke with pt for a long time this am. First, she wanted to cancel her Hepatitis B injection. Pt wants information about the vaccine, why she needs it and possible side effects. Explained to pt she has antibodies for Hep A, but none for Hep B. Due to her possible compromised liver, Dr Rhea Belton feels she needs the therapy; Dr Rhea Belton would not be doing his job as a gastroenterologist if he did not recommend the therapy. Pt replied she doesn't take any meds except for HTN and she's not sure she will get the vaccine. She reports she had the Whooping Cough vaccine and had terrible SEs. Pt  reports her main concern is getting her blood pressure under control and she will consider the therapy. Second, pt wanted to talk to me about how she was informed about the injections. Pt stated she was on the table waiting for her procedure to begin, she was worried about her BP,  when I entered. I apologized to the pt and stated I wrote her a note explaining everything, but I felt I needed to inform her in person rather than mailing it to her. I knew Dr Rhea Belton was late and she hadn't had any sedation and would be alert. I offered to have her speak with my director and she refused. We talked for several more minutes; I read the note Dr Rhea Belton sent to me about her need for Hep B injections and the need for alcohol cessation prior to repeating her labs. The call ended with me r/s the 1st Hep B injection a month later. Pt stated she will call me to schedule for a convenient time for her if she chooses to have the injection. I also mailed her several notes on Hepatitis and the Hep B Vaccine and I will send her the appt date for her labs. Again, I apologized to the pt for upsetting her and she accepted my apology prior to ending the call.

## 2011-07-07 ENCOUNTER — Ambulatory Visit (INDEPENDENT_AMBULATORY_CARE_PROVIDER_SITE_OTHER): Payer: Medicare Other | Admitting: Cardiovascular Disease

## 2011-07-07 ENCOUNTER — Telehealth: Payer: Self-pay | Admitting: *Deleted

## 2011-07-07 ENCOUNTER — Encounter: Payer: Self-pay | Admitting: Cardiovascular Disease

## 2011-07-07 VITALS — BP 152/98 | HR 76 | Wt 192.1 lb

## 2011-07-07 DIAGNOSIS — R002 Palpitations: Secondary | ICD-10-CM

## 2011-07-07 MED ORDER — LISINOPRIL 5 MG PO TABS: 5.0000 mg | ORAL_TABLET | Freq: Every day | ORAL | Status: DC

## 2011-07-07 MED ORDER — DILTIAZEM HCL ER COATED BEADS 120 MG PO CP24: 120.0000 mg | ORAL_CAPSULE | Freq: Every day | ORAL | Status: DC

## 2011-07-07 NOTE — Assessment & Plan Note (Addendum)
She had several short runs of SVT. She also has PVCs. Will start Cardizem CD 120 mg po QDaily. At this time, she is asymptomatic. If she has problems with Cardizem, will try another rate control agent.

## 2011-07-07 NOTE — Patient Instructions (Signed)
Your physician wants you to follow-up in: 6 months. You will receive a reminder letter in the mail two months in advance. If you don't receive a letter, please call our office to schedule the follow-up appointment.  Your physician has recommended you make the following change in your medication:  Start Cardizem CD 120 mg by mouth daily   

## 2011-07-07 NOTE — Progress Notes (Signed)
History of Present Illness:65 yo WF with h/o HTN here today for cardiac follow up. I saw her as a new patient August 2012 for further evaluation of palpitations. She was seen by Dr. Collins Scotland on 05/19/11 and EKG with frequent PVCs. She has a previous history of palpitations and had a normal stress test 6-7 years. She has no prior documented cardiac disease. She reports weight gain and recent worsening dyspnea that she believes is from her increased weight. She denies any feelings of her heart racing. She does notice her pulse skipping beats. This does wake her up at night. This occurs almost every day. No related near syncope or syncope. No chest pain.  I arranged an echo and a 48 hour Holter monitor. Her echo showed normal LV size and function with EF of 70%. There was mild aortic valve disease with mild stenosis and regurgitation. Her Holter showed normal sinus rhythm at baseline with frequent PVCs and several runs of SVT (longest 104 beasts at 177 beats per minute). She has been feeling well. She has lost 2 pounds. She has been exercising every day. She denies feeling any tachycardia, No dizziness.    Past Medical History  Diagnosis Date  . Staph aureus infection 2007    rt. shin  . Hypertension   . Rheumatoid arthritis   . Osteopenia 05/2011    t score -1.9  . Elevated LFTs 05/19/11  . Heart palpitations   . Hepatic steatosis 05/24/11  . Psoriasis   . Obesity   . Hepatitis, unspecified   . Asthma     mild asthma from dairy allergies  . Ulcer   . GERD (gastroesophageal reflux disease)     Past Surgical History  Procedure Date  . Hernia repair 2003    bi-lat  repair   . Appendectomy 1998  . Tummy tuck 2003  . Ovarian cyst removal 1978    from left ovary   . Facial reconstruction surgery     Current Outpatient Prescriptions  Medication Sig Dispense Refill  . AMBULATORY NON FORMULARY MEDICATION Medication Name: mega red 300mg  take one tablet by mouth once a day       . aspirin 325 MG  tablet Take 325 mg by mouth 2 (two) times daily.        . B Complex-C (SUPER B COMPLEX PO) Take 1 capsule by mouth daily.       Marland Kitchen CALCIUM PO Take 1 capsule by mouth 2 (two) times daily.       . Cholecalciferol (VITAMIN D PO) Take 1 capsule by mouth daily.       . Coenzyme Q10 (CO Q-10 PO) Take 1 capsule by mouth daily.       . Cyanocobalamin (VITAMIN B-12 PO) Take 1 capsule by mouth daily.       Marland Kitchen lisinopril (PRINIVIL,ZESTRIL) 5 MG tablet Take 5 mg by mouth daily.        . milk thistle 175 MG tablet Take 250 mg by mouth daily.       . Multiple Vitamin (MULTIVITAMIN PO) Take 1 capsule by mouth daily.       . NON FORMULARY Take 1 tablet by mouth daily. Livedetox       . Omega-3 Fatty Acids (FISH OIL PO) Take by mouth.          Allergies  Allergen Reactions  . Dairy Aid (Lactase)   . Sulfa Antibiotics Rash    History   Social History  . Marital Status: Married  Spouse Name: N/A    Number of Children: 3  . Years of Education: N/A   Occupational History  . retired    Social History Main Topics  . Smoking status: Never Smoker   . Smokeless tobacco: Never Used  . Alcohol Use: 8.4 oz/week    14 Glasses of wine per week     at least two glasses of wine sometimes more nightly  . Drug Use: No  . Sexually Active: Not Currently -- Female partner(s)    Birth Control/ Protection: Post-menopausal   Other Topics Concern  . Not on file   Social History Narrative  . No narrative on file    Family History  Problem Relation Age of Onset  . Hypertension Mother   . Heart disease Mother   . Hypertension Father   . Heart disease Father   . Hypertension Brother   . Prostate cancer Brother   . Hypertension Maternal Grandmother   . Heart attack Mother   . Heart attack Father   . Colon cancer Neg Hx     Review of Systems:  As stated in the HPI and otherwise negative.   BP 152/98  Pulse 76  Wt 192 lb 1.9 oz (87.145 kg)  Physical Examination: General: Well developed, well  nourished, NAD HEENT: OP clear, mucus membranes moist SKIN: warm, dry. No rashes. Neuro: No focal deficits Musculoskeletal: Muscle strength 5/5 all ext Psychiatric: Mood and affect normal Neck: No JVD, no carotid bruits, no thyromegaly, no lymphadenopathy. Lungs:Clear bilaterally, no wheezes, rhonci, crackles Cardiovascular: Regular rate and rhythm. No murmurs, gallops or rubs. Abdomen:Soft. Bowel sounds present. Non-tender.  Extremities: No lower extremity edema. Pulses are 2 + in the bilateral DP/PT.  EKG:NSR, rate 76 bpm. Normal EKG.   48 Hour Holter Monitor: NSR, PVCs. Several runs of SVT (longest 104 beats at 177 beats/min)  Echo 06/18/11:   Left ventricle: Wall thickness was increased in a pattern of mild LVH. There was focal basal hypertrophy. Systolic function was vigorous. The estimated ejection fraction was in the range of 65% to 70%. - Aortic valve: Trivial regurgitation. Mean gradient: 14mm Hg (S). Peak gradient: 23mm Hg (S).

## 2011-07-07 NOTE — Telephone Encounter (Signed)
Madison Gutierrez will enter pt for 5 year Colon recall.

## 2011-07-19 ENCOUNTER — Encounter: Payer: Self-pay | Admitting: Cardiovascular Disease

## 2012-01-31 ENCOUNTER — Encounter: Payer: Self-pay | Admitting: Physician Assistant

## 2012-01-31 ENCOUNTER — Ambulatory Visit (INDEPENDENT_AMBULATORY_CARE_PROVIDER_SITE_OTHER): Payer: Medicare Other | Admitting: Physician Assistant

## 2012-01-31 ENCOUNTER — Telehealth: Payer: Self-pay | Admitting: Physician Assistant

## 2012-01-31 VITALS — BP 130/90 | HR 96 | Ht 64.0 in | Wt 173.8 lb

## 2012-01-31 DIAGNOSIS — R002 Palpitations: Secondary | ICD-10-CM

## 2012-01-31 DIAGNOSIS — I10 Essential (primary) hypertension: Secondary | ICD-10-CM

## 2012-01-31 MED ORDER — HYDROCHLOROTHIAZIDE 25 MG PO TABS: 12.5000 mg | ORAL_TABLET | Freq: Every day | ORAL | Status: DC

## 2012-01-31 MED ORDER — LISINOPRIL 5 MG PO TABS: 10.0000 mg | ORAL_TABLET | Freq: Two times a day (BID) | ORAL | Status: DC

## 2012-01-31 NOTE — Assessment & Plan Note (Signed)
Blood pressure has been elevated recently as high as 178/117 according to the patient. Today it is reasonable at 130/90. The patient has been self titrating her medications and is most recently taking lisinopril 5 mg tablets 2 in the morning and 2 in the evening for the past 3 days. I will leave her on this dose of 10 mg b.i.d. Will add hydrochlorothiazide 25 mg one half tablet daily. We can increase this to 25 mg daily if needed. She should also follow a 2 g sodium diet. She is taking her blood pressures regularly at home.

## 2012-01-31 NOTE — Progress Notes (Signed)
HPI:  This is a very anxious 66 year old white female patient who comes in today because of elevated blood pressure. She is scheduled for a breast reduction and I surgery by a plastic surgeon at [facility] on Monday and needs to get her blood pressure under control. She has been taking lisinopril 5 mg daily for a few years. She is recently titrated up on her own to 20 mg daily. She says her blood pressure comes down when she loses weight. She is under a last stress as her husband of 47 years has just left her for another woman and she is having significant marital problems.  The patient saw Dr. Clifton James in September 2012 at which time she was having palpitations. She had a 48 hour Holter showed frequent PVCs and several runs of SVT the longest of 104 beats at 177 beats per minute. Echo showed normal LV size and LV function ejection fraction 70% with mild aortic valve disease with mild stenosis or regurgitation. He prescribed Cardizem 120 mg daily. The patient never took this because she wasn't having regular palpitations anymore. She currently denies problems with palpitations, chest pain, dizziness, or presyncope.  Allergies  Allergen Reactions  . Dairy Aid (Lactase)   . Sulfa Antibiotics Rash    Current Outpatient Prescriptions on File Prior to Visit  Medication Sig Dispense Refill  . aspirin 325 MG tablet Take 325 mg by mouth 2 (two) times daily.        . milk thistle 175 MG tablet Take 250 mg by mouth daily.       . NON FORMULARY Take 1 tablet by mouth daily. Livedetox       . DISCONTD: lisinopril (PRINIVIL,ZESTRIL) 5 MG tablet Take 1 tablet (5 mg total) by mouth daily.  30 tablet  11  . B Complex-C (SUPER B COMPLEX PO) Take 1 capsule by mouth daily.       Marland Kitchen CALCIUM PO Take 1 capsule by mouth 2 (two) times daily.       . Cholecalciferol (VITAMIN D PO) Take 1 capsule by mouth daily.       . Coenzyme Q10 (CO Q-10 PO) Take 1 capsule by mouth daily.       . Cyanocobalamin (VITAMIN B-12  PO) Take 1 capsule by mouth daily.       Marland Kitchen diltiazem (CARDIZEM CD) 120 MG 24 hr capsule Take 1 capsule (120 mg total) by mouth daily.  30 capsule  11  . Multiple Vitamin (MULTIVITAMIN PO) Take 1 capsule by mouth daily.       . Omega-3 Fatty Acids (FISH OIL PO) Take by mouth.          Past Medical History  Diagnosis Date  . Staph aureus infection 2007    rt. shin  . Hypertension   . Rheumatoid arthritis   . Osteopenia 05/2011    t score -1.9  . Elevated LFTs 05/19/11  . Heart palpitations   . Hepatic steatosis 05/24/11  . Psoriasis   . Obesity   . Hepatitis, unspecified   . Asthma     mild asthma from dairy allergies  . Ulcer   . GERD (gastroesophageal reflux disease)   . SVT (supraventricular tachycardia)     Past Surgical History  Procedure Date  . Hernia repair 2003    bi-lat  repair   . Appendectomy 1998  . Tummy tuck 2003  . Ovarian cyst removal 1978    from left ovary   . Facial reconstruction  surgery     Family History  Problem Relation Age of Onset  . Hypertension Mother   . Heart disease Mother   . Hypertension Father   . Heart disease Father   . Hypertension Brother   . Prostate cancer Brother   . Hypertension Maternal Grandmother   . Heart attack Mother   . Heart attack Father   . Colon cancer Neg Hx     History   Social History  . Marital Status: Married    Spouse Name: N/A    Number of Children: 3  . Years of Education: N/A   Occupational History  . retired    Social History Main Topics  . Smoking status: Never Smoker   . Smokeless tobacco: Never Used  . Alcohol Use: 8.4 oz/week    14 Glasses of wine per week     at least two glasses of wine sometimes more nightly  . Drug Use: No  . Sexually Active: Not Currently -- Female partner(s)    Birth Control/ Protection: Post-menopausal   Other Topics Concern  . Not on file   Social History Narrative  . No narrative on file    ROS:see history of present illness otherwise  negative   PHYSICAL EXAM: Well-nournished, in no acute distress. Neck: No JVD, HJR, Bruit, or thyroid enlargement Lungs: No tachypnea, clear without wheezing, rales, or rhonchi Cardiovascular: RRR, PMI not displaced,1/6 systolic murmur at the left sternal border, no gallops, bruit, thrill, or heave. Abdomen: BS normal. Soft without organomegaly, masses, lesions or tenderness. Extremities: without cyanosis, clubbing or edema. Good distal pulses bilateral SKin: Warm, no lesions or rashes  Musculoskeletal: No deformities Neuro: no focal signs  BP 130/90  Pulse 96  Ht 5\' 4"  (1.626 m)  Wt 173 lb 12.8 oz (78.835 kg)  BMI 29.83 kg/m2   ZOX:WRUEAV sinus rhythm at 96 beats per minute

## 2012-01-31 NOTE — Telephone Encounter (Signed)
Rx sent to [facility] on [address].  Pt requesting 5mg  tablets of the lisinopril.  She was notified that rx has been sent

## 2012-01-31 NOTE — Patient Instructions (Addendum)
Your physician has recommended you make the following change in your medication: Take Lisinopril 10 mg; 1 tablet twice daily.  Start HCTZ 25 mg, take 1/2 tablet each day  Follow up with Dr. Yehuda Budd regarding your blood pressure.    2 Gram Low Sodium Diet A 2 gram sodium diet restricts the amount of sodium in the diet to no more than 2 g or 2000 mg daily. Limiting the amount of sodium is often used to help lower blood pressure. It is important if you have heart, liver, or kidney problems. Many foods contain sodium for flavor and sometimes as a preservative. When the amount of sodium in a diet needs to be low, it is important to know what to look for when choosing foods and drinks. The following includes some information and guidelines to help make it easier for you to adapt to a low sodium diet. QUICK TIPS  Do not add salt to food.   Avoid convenience items and fast food.   Choose unsalted snack foods.   Buy lower sodium products, often labeled as "lower sodium" or "no salt added."   Check food labels to learn how much sodium is in 1 serving.   When eating at a restaurant, ask that your food be prepared with less salt or none, if possible.  READING FOOD LABELS FOR SODIUM INFORMATION The nutrition facts label is a good place to find how much sodium is in foods. Look for products with no more than 500 to 600 mg of sodium per meal and no more than 150 mg per serving. Remember that 2 g = 2000 mg. The food label may also list foods as:  Sodium-free: Less than 5 mg in a serving.   Very low sodium: 35 mg or less in a serving.   Low-sodium: 140 mg or less in a serving.   Light in sodium: 50% less sodium in a serving. For example, if a food that usually has 300 mg of sodium is changed to become light in sodium, it will have 150 mg of sodium.   Reduced sodium: 25% less sodium in a serving. For example, if a food that usually has 400 mg of sodium is changed to reduced sodium, it will have 300 mg  of sodium.  CHOOSING FOODS Grains  Avoid: Salted crackers and snack items. Some cereals, including instant hot cereals. Bread stuffing and biscuit mixes. Seasoned rice or pasta mixes.   Choose: Unsalted snack items. Low-sodium cereals, oats, puffed wheat and rice, shredded wheat. English muffins and bread. Pasta.  Meats  Avoid: Salted, canned, smoked, spiced, pickled meats, including fish and poultry. Bacon, ham, sausage, cold cuts, hot dogs, anchovies.   Choose: Low-sodium canned tuna and salmon. Fresh or frozen meat, poultry, and fish.  Dairy  Avoid: Processed cheese and spreads. Cottage cheese. Buttermilk and condensed milk. Regular cheese.   Choose: Milk. Low-sodium cottage cheese. Yogurt. Sour cream. Low-sodium cheese.  Fruits and Vegetables  Avoid: Regular canned vegetables. Regular canned tomato sauce and paste. Frozen vegetables in sauces. Olives. Rosita Fire. Relishes. Sauerkraut.   Choose: Low-sodium canned vegetables. Low-sodium tomato sauce and paste. Frozen or fresh vegetables. Fresh and frozen fruit.  Condiments  Avoid: Canned and packaged gravies. Worcestershire sauce. Tartar sauce. Barbecue sauce. Soy sauce. Steak sauce. Ketchup. Onion, garlic, and table salt. Meat flavorings and tenderizers.   Choose: Fresh and dried herbs and spices. Low-sodium varieties of mustard and ketchup. Lemon juice. Tabasco sauce. Horseradish.  SAMPLE 2 GRAM SODIUM MEAL PLAN Breakfast /  Sodium (mg)  1 cup low-fat milk / 143 mg   2 slices whole-wheat toast / 270 mg   1 tbs heart-healthy margarine / 153 mg   1 hard-boiled egg / 139 mg   1 small orange / 0 mg  Lunch / Sodium (mg)  1 cup raw carrots / 76 mg    cup hummus / 298 mg   1 cup low-fat milk / 143 mg    cup red grapes / 2 mg   1 whole-wheat pita bread / 356 mg  Dinner / Sodium (mg)  1 cup whole-wheat pasta / 2 mg   1 cup low-sodium tomato sauce / 73 mg   3 oz lean ground beef / 57 mg   1 small side salad (1 cup  raw spinach leaves,  cup cucumber,  cup yellow bell pepper) with 1 tsp olive oil and 1 tsp red wine vinegar / 25 mg  Snack / Sodium (mg)  1 container low-fat vanilla yogurt / 107 mg   3 graham cracker squares / 127 mg  Nutrient Analysis  Calories: 2033   Protein: 77 g   Carbohydrate: 282 g   Fat: 72 g   Sodium: 1971 mg  Document Released: 09/27/2005 Document Revised: 09/16/2011 Document Reviewed: 12/29/2009 [facility] Patient Information 2012 [place], [place].

## 2012-01-31 NOTE — Telephone Encounter (Signed)
New msg Pt said she was here today and did not get meds- lisinopril and hctz She [facility] [address]

## 2012-01-31 NOTE — Assessment & Plan Note (Signed)
Palpitations resolved.  

## 2013-02-12 ENCOUNTER — Other Ambulatory Visit: Payer: Self-pay | Admitting: Emergency Medicine

## 2013-02-12 MED ORDER — LISINOPRIL 5 MG PO TABS: 10.0000 mg | ORAL_TABLET | Freq: Two times a day (BID) | ORAL | Status: DC

## 2013-02-13 ENCOUNTER — Other Ambulatory Visit: Payer: Self-pay | Admitting: *Deleted

## 2013-02-13 MED ORDER — LISINOPRIL 10 MG PO TABS: 10.0000 mg | ORAL_TABLET | Freq: Two times a day (BID) | ORAL | Status: DC

## 2013-05-14 ENCOUNTER — Other Ambulatory Visit: Payer: Self-pay | Admitting: *Deleted

## 2013-05-14 MED ORDER — LISINOPRIL 10 MG PO TABS: 10.0000 mg | ORAL_TABLET | Freq: Two times a day (BID) | ORAL | Status: DC

## 2013-09-20 ENCOUNTER — Other Ambulatory Visit: Payer: Self-pay

## 2013-09-20 MED ORDER — LISINOPRIL 10 MG PO TABS: 10.0000 mg | ORAL_TABLET | Freq: Two times a day (BID) | ORAL | Status: DC

## 2013-10-18 ENCOUNTER — Other Ambulatory Visit: Payer: Self-pay | Admitting: Cardiovascular Disease

## 2013-10-19 NOTE — Telephone Encounter (Signed)
Spoke with Dr Angelena Form  Concerning pt having refills on lisinopril and he said that was fine to give refills but pt needs to get in to see him will forward message to sch

## 2013-11-16 ENCOUNTER — Ambulatory Visit (INDEPENDENT_AMBULATORY_CARE_PROVIDER_SITE_OTHER): Payer: Medicare Other | Admitting: Cardiovascular Disease

## 2013-11-16 ENCOUNTER — Encounter: Payer: Self-pay | Admitting: Cardiovascular Disease

## 2013-11-16 VITALS — BP 122/80 | HR 84 | Ht 64.0 in | Wt 189.0 lb

## 2013-11-16 DIAGNOSIS — I471 Supraventricular tachycardia, unspecified: Secondary | ICD-10-CM

## 2013-11-16 DIAGNOSIS — I498 Other specified cardiac arrhythmias: Secondary | ICD-10-CM

## 2013-11-16 DIAGNOSIS — I10 Essential (primary) hypertension: Secondary | ICD-10-CM

## 2013-11-16 DIAGNOSIS — I350 Nonrheumatic aortic (valve) stenosis: Secondary | ICD-10-CM

## 2013-11-16 DIAGNOSIS — I359 Nonrheumatic aortic valve disorder, unspecified: Secondary | ICD-10-CM

## 2013-11-16 NOTE — Progress Notes (Signed)
History of Present Illness: 68 yo WF with h/o HTN, palpitations here today for cardiac follow up. I saw her as a new patient August 2012 for further evaluation of palpitations. She was seen by Dr. Modena Morrow on 05/19/11 and EKG with frequent PVCs. I arranged an echo and a 48 hour Holter monitor August 2012. Her echo showed normal LV size and function with EF of 70%. There was mild aortic valve disease with mild stenosis and regurgitation. Her Holter showed normal sinus rhythm at baseline with frequent PVCs and several runs of SVT (longest 104 beats at 177 beats per minute). She was started on Cardizem CD 120 mg po Qdaily but did not take this because she was feeling well. She was seen by Estella Husk, PA-C 01/31/12 and was doing well. Her Lisinopril was increased and HTCZ was added for better BP control.   She is here today for follow up. She has had no palpitations, chest pain or SOB.   Primary Care Physician:  Greta Doom   Past Medical History  Diagnosis Date  . Staph aureus infection 2007    rt. shin  . Hypertension   . Rheumatoid arthritis(714.0)   . Osteopenia 05/2011    t score -1.9  . Elevated LFTs 05/19/11  . Heart palpitations   . Hepatic steatosis 05/24/11  . Psoriasis   . Obesity   . Hepatitis, unspecified   . Asthma     mild asthma from dairy allergies  . Ulcer   . GERD (gastroesophageal reflux disease)   . SVT (supraventricular tachycardia)     Past Surgical History  Procedure Laterality Date  . Hernia repair  2003    bi-lat  repair   . Appendectomy  1998  . Tummy tuck  2003  . Ovarian cyst removal  1978    from left ovary   . Facial reconstruction surgery      Current Outpatient Prescriptions  Medication Sig Dispense Refill  . B Complex-C (SUPER B COMPLEX PO) Take 1 capsule by mouth daily.       Marland Kitchen CALCIUM PO Take 1 capsule by mouth 2 (two) times daily.       . Cholecalciferol (VITAMIN D PO) Take 1 capsule by mouth daily.       Marland Kitchen lisinopril (PRINIVIL,ZESTRIL) 10 MG  tablet TAKE 1 TABLET (10 MG TOTAL) BY MOUTH ONE TIME DAILY.      . milk thistle 175 MG tablet Take 250 mg by mouth daily.       . Multiple Vitamin (MULTIVITAMIN PO) Take 1 capsule by mouth daily.       . NON FORMULARY Take 1 tablet by mouth daily. Livedetox       . Omega-3 Fatty Acids (FISH OIL PO) Take by mouth.         No current facility-administered medications for this visit.    Allergies  Allergen Reactions  . Dairy Aid [Lactase]   . Sulfa Antibiotics Rash    History   Social History  . Marital Status: Married    Spouse Name: N/A    Number of Children: 3  . Years of Education: N/A   Occupational History  . retired    Social History Main Topics  . Smoking status: Never Smoker   . Smokeless tobacco: Never Used  . Alcohol Use: 8.4 oz/week    14 Glasses of wine per week     Comment: at least two glasses of wine sometimes more nightly  . Drug Use:  No  . Sexual Activity: Not Currently    Partners: Male    Birth Control/ Protection: Post-menopausal   Other Topics Concern  . Not on file   Social History Narrative  . No narrative on file    Family History  Problem Relation Age of Onset  . Hypertension Mother   . Heart disease Mother   . Hypertension Father   . Heart disease Father   . Hypertension Brother   . Prostate cancer Brother   . Hypertension Maternal Grandmother   . Heart attack Mother   . Heart attack Father   . Colon cancer Neg Hx     Review of Systems:  As stated in the HPI and otherwise negative.   BP 122/80  Pulse 84  Ht 5\' 4"  (1.626 m)  Wt 189 lb (85.73 kg)  BMI 32.43 kg/m2  Physical Examination: General: Well developed, well nourished, NAD HEENT: OP clear, mucus membranes moist SKIN: warm, dry. No rashes. Neuro: No focal deficits Musculoskeletal: Muscle strength 5/5 all ext Psychiatric: Mood and affect normal Neck: No JVD, no carotid bruits, no thyromegaly, no lymphadenopathy. Lungs:Clear bilaterally, no wheezes, rhonci,  crackles Cardiovascular: Regular rate and rhythm. No murmurs, gallops or rubs. Abdomen:Soft. Bowel sounds present. Non-tender.  Extremities: No lower extremity edema. Pulses are 2 + in the bilateral DP/PT.  EKG: NSR, rate 84 bpm.   Assessment and Plan:   1. SVT: No recent palpitations.   2. HTN: BP has been well controlled. She has been taking Lisinopril 10 mg po Qdaily only. No changes.   3. Aortic valve stenosis: mild AS by echo 2012. Will repeat echo now.

## 2013-11-16 NOTE — Patient Instructions (Signed)

## 2013-12-24 ENCOUNTER — Ambulatory Visit (HOSPITAL_COMMUNITY): Payer: Medicare Other | Attending: Internal Medicine | Admitting: Radiology

## 2013-12-24 ENCOUNTER — Encounter: Payer: Self-pay | Admitting: Internal Medicine

## 2013-12-24 DIAGNOSIS — I359 Nonrheumatic aortic valve disorder, unspecified: Secondary | ICD-10-CM | POA: Insufficient documentation

## 2013-12-24 DIAGNOSIS — R0602 Shortness of breath: Secondary | ICD-10-CM

## 2013-12-24 DIAGNOSIS — I350 Nonrheumatic aortic (valve) stenosis: Secondary | ICD-10-CM

## 2013-12-24 DIAGNOSIS — R011 Cardiac murmur, unspecified: Secondary | ICD-10-CM

## 2013-12-24 NOTE — Progress Notes (Signed)
Echocardiogram Performed. 

## 2014-03-21 ENCOUNTER — Encounter: Payer: Medicare Other | Admitting: Dietician

## 2014-05-14 ENCOUNTER — Other Ambulatory Visit: Payer: Self-pay | Admitting: Cardiovascular Disease

## 2014-06-21 ENCOUNTER — Other Ambulatory Visit: Payer: Self-pay | Admitting: Cardiovascular Disease

## 2014-08-12 ENCOUNTER — Encounter: Payer: Self-pay | Admitting: Cardiovascular Disease

## 2014-11-18 ENCOUNTER — Ambulatory Visit: Payer: Medicare Other | Admitting: Cardiovascular Disease

## 2014-11-22 ENCOUNTER — Ambulatory Visit: Payer: Medicare Other | Admitting: Cardiovascular Disease

## 2014-11-26 ENCOUNTER — Encounter: Payer: Self-pay | Admitting: Cardiovascular Disease

## 2014-11-26 ENCOUNTER — Ambulatory Visit (INDEPENDENT_AMBULATORY_CARE_PROVIDER_SITE_OTHER): Payer: Medicare Other | Admitting: Cardiovascular Disease

## 2014-11-26 VITALS — BP 130/80 | HR 86 | Ht 64.0 in | Wt 203.0 lb

## 2014-11-26 DIAGNOSIS — I350 Nonrheumatic aortic (valve) stenosis: Secondary | ICD-10-CM

## 2014-11-26 DIAGNOSIS — I471 Supraventricular tachycardia, unspecified: Secondary | ICD-10-CM

## 2014-11-26 DIAGNOSIS — I10 Essential (primary) hypertension: Secondary | ICD-10-CM

## 2014-11-26 DIAGNOSIS — I447 Left bundle-branch block, unspecified: Secondary | ICD-10-CM

## 2014-11-26 NOTE — Patient Instructions (Signed)
Your physician wants you to follow-up in:  12 months.  You will receive a reminder letter in the mail two months in advance. If you don't receive a letter, please call our office to schedule the follow-up appointment.   

## 2014-11-26 NOTE — Progress Notes (Signed)
History of Present Illness: 69 yo WF with h/o HTN, palpitations here today for cardiac follow up. I saw her as a new patient August 2012 for further evaluation of palpitations. She was seen by Dr. Modena Morrow on 05/19/11 and EKG with frequent PVCs. I arranged an echo and a 48 hour Holter monitor August 2012. Her echo showed normal LV size and function with EF of 70%. There was mild aortic valve disease with mild stenosis and regurgitation. Her Holter showed normal sinus rhythm at baseline with frequent PVCs and several runs of SVT (longest 104 beats at 177 beats per minute). She was started on Cardizem CD 120 mg po Qdaily but did not take this because she was feeling well. She has been taking Lisinopril for BP control.   She is here today for follow up. She has had no palpitations, chest pain or SOB. Upset over weight gain.   Primary Care Physician:  Modena Morrow   Past Medical History  Diagnosis Date  . Staph aureus infection 2007    rt. shin  . Hypertension   . Rheumatoid arthritis(714.0)   . Osteopenia 05/2011    t score -1.9  . Elevated LFTs 05/19/11  . Heart palpitations   . Hepatic steatosis 05/24/11  . Psoriasis   . Obesity   . Hepatitis, unspecified   . Asthma     mild asthma from dairy allergies  . Ulcer   . GERD (gastroesophageal reflux disease)   . SVT (supraventricular tachycardia)     Past Surgical History  Procedure Laterality Date  . Hernia repair  2003    bi-lat  repair   . Appendectomy  1998  . Tummy tuck  2003  . Ovarian cyst removal  1978    from left ovary   . Facial reconstruction surgery      Current Outpatient Prescriptions  Medication Sig Dispense Refill  . lisinopril (PRINIVIL,ZESTRIL) 10 MG tablet TAKE 1 TABLET (10 MG TOTAL) BY MOUTH ONE TIME DAILY.    . Multiple Vitamin (MULTIVITAMIN PO) Take 1 capsule by mouth daily.      No current facility-administered medications for this visit.    Allergies  Allergen Reactions  . Dairy Aid [Lactase]   . Sulfa  Antibiotics Rash    History   Social History  . Marital Status: Married    Spouse Name: N/A  . Number of Children: 3  . Years of Education: N/A   Occupational History  . retired    Social History Main Topics  . Smoking status: Never Smoker   . Smokeless tobacco: Never Used  . Alcohol Use: 8.4 oz/week    14 Glasses of wine per week     Comment: at least two glasses of wine sometimes more nightly  . Drug Use: No  . Sexual Activity:    Partners: Male    Birth Control/ Protection: Post-menopausal   Other Topics Concern  . Not on file   Social History Narrative    Family History  Problem Relation Age of Onset  . Hypertension Mother   . Heart disease Mother   . Hypertension Father   . Heart disease Father   . Hypertension Brother   . Prostate cancer Brother   . Hypertension Maternal Grandmother   . Heart attack Mother   . Heart attack Father   . Colon cancer Neg Hx     Review of Systems:  As stated in the HPI and otherwise negative.   BP 130/80 mmHg  Pulse 86  Ht 5\' 4"  (1.626 m)  Wt 203 lb (92.08 kg)  BMI 34.83 kg/m2  Physical Examination: General: Well developed, well nourished, NAD HEENT: OP clear, mucus membranes moist SKIN: warm, dry. No rashes. Neuro: No focal deficits Musculoskeletal: Muscle strength 5/5 all ext Psychiatric: Mood and affect normal Neck: No JVD, no carotid bruits, no thyromegaly, no lymphadenopathy. Lungs:Clear bilaterally, no wheezes, rhonci, crackles Cardiovascular: Regular rate and rhythm. No murmurs, gallops or rubs. Abdomen:Soft. Bowel sounds present. Non-tender.  Extremities: No lower extremity edema. Pulses are 2 + in the bilateral DP/PT.  Echo 12/24/13: Left ventricle: The cavity size was normal. There was moderate concentric hypertrophy. Systolic function was normal. The estimated ejection fraction was in the range of 60% to 65%. Wall motion was normal; there were no regional wall motion abnormalities. Doppler  parameters are consistent with abnormal left ventricular relaxation (grade 1 diastolic dysfunction). The E/e' ratio is >10, suggesting elevated LV filling pressure. - Aortic valve: Mildly calcified leaflets. Transvalvular velocity was minimally increased. There is a small subvalvular gradient. PEak and mean gradients across the valve are 17 and 11 mmHg, respectively. The calculated AVA is 2.0 cm2, consistent with very mild aortic stenosis. Mild regurgitation. Valve area: 1.98cm^2(VTI). Valve area: 2.19cm^2 (Vmax). - Mitral valve: Mildly thickened leaflets . Chordal sam. Mild regurgitation. - Inferior vena cava: The vessel was normal in size; the respirophasic diameter changes were in the normal range (= 50%); findings are consistent with normal central venous pressure. - Pericardium, extracardiac: There was no pericardial effusion.  EKG: NSR, rate 86 bpm. LBBB  Assessment and Plan:   1. SVT: No recent palpitations. No changes.   2. HTN: BP has been well controlled. She has been taking Lisinopril 10 mg po Qdaily only. No changes.   3. Aortic valve stenosis: mild AS by echo 2015. Repeat in 2017.   4. LBBB: No ischemic symptoms. Not known to have ischemic heart disease.

## 2014-12-24 ENCOUNTER — Other Ambulatory Visit: Payer: Self-pay | Admitting: Cardiovascular Disease

## 2015-03-25 ENCOUNTER — Other Ambulatory Visit (INDEPENDENT_AMBULATORY_CARE_PROVIDER_SITE_OTHER): Payer: Medicare Other

## 2015-03-25 ENCOUNTER — Ambulatory Visit (INDEPENDENT_AMBULATORY_CARE_PROVIDER_SITE_OTHER): Payer: Medicare Other | Admitting: Internal Medicine

## 2015-03-25 ENCOUNTER — Encounter: Payer: Self-pay | Admitting: Internal Medicine

## 2015-03-25 VITALS — BP 148/82 | HR 86 | Temp 97.8°F | Resp 18 | Ht 64.0 in | Wt 207.0 lb

## 2015-03-25 DIAGNOSIS — Z0000 Encounter for general adult medical examination without abnormal findings: Secondary | ICD-10-CM | POA: Diagnosis not present

## 2015-03-25 DIAGNOSIS — L409 Psoriasis, unspecified: Secondary | ICD-10-CM

## 2015-03-25 DIAGNOSIS — R748 Abnormal levels of other serum enzymes: Secondary | ICD-10-CM

## 2015-03-25 DIAGNOSIS — E669 Obesity, unspecified: Secondary | ICD-10-CM

## 2015-03-25 DIAGNOSIS — I10 Essential (primary) hypertension: Secondary | ICD-10-CM

## 2015-03-25 LAB — COMPREHENSIVE METABOLIC PANEL
ALT: 129 U/L — ABNORMAL HIGH (ref 0–35)
AST: 123 U/L — ABNORMAL HIGH (ref 0–37)
Albumin: 4 g/dL (ref 3.5–5.2)
Alkaline Phosphatase: 100 U/L (ref 39–117)
BUN: 18 mg/dL (ref 6–23)
CALCIUM: 9.3 mg/dL (ref 8.4–10.5)
CHLORIDE: 104 meq/L (ref 96–112)
CO2: 31 mEq/L (ref 19–32)
CREATININE: 0.63 mg/dL (ref 0.40–1.20)
GFR: 99.34 mL/min (ref >60.00)
Glucose, Bld: 116 mg/dL — ABNORMAL HIGH (ref 70–99)
POTASSIUM: 4.5 meq/L (ref 3.5–5.1)
SODIUM: 139 meq/L (ref 135–145)
TOTAL PROTEIN: 7 g/dL (ref 6.0–8.3)
Total Bilirubin: 0.5 mg/dL (ref 0.2–1.2)

## 2015-03-25 LAB — LIPID PANEL
Cholesterol: 194 mg/dL (ref 0–200)
HDL: 45 mg/dL (ref >39.00)
LDL Cholesterol: 121 mg/dL — ABNORMAL HIGH (ref 0–99)
NonHDL: 149
TRIGLYCERIDES: 139 mg/dL (ref 0.0–149.0)
Total CHOL/HDL Ratio: 4
VLDL: 27.8 mg/dL (ref 0.0–40.0)

## 2015-03-25 LAB — HEMOGLOBIN A1C: HEMOGLOBIN A1C: 5.3 % (ref 4.6–6.5)

## 2015-03-25 LAB — TSH: TSH: 1.14 u[IU]/mL (ref 0.35–4.50)

## 2015-03-25 NOTE — Assessment & Plan Note (Signed)
Referral to rheumatology to see if her ankle pain is related to her psoriasis. Offered sports medicine and she did not wish to start with them. Talked to her about icing and NSAIDs for pain and inflammation for now.

## 2015-03-25 NOTE — Assessment & Plan Note (Signed)
BP mildly elevated today and she has not taken medicine today. Per her reports she is well controlled at home. Recent ankle pain could also be increasing it slightly today. Will check BMP and adjust as needed. Continue lisinopril for now.

## 2015-03-25 NOTE — Progress Notes (Signed)
Pre visit review using our clinic review tool, if applicable. No additional management support is needed unless otherwise documented below in the visit note. 

## 2015-03-25 NOTE — Patient Instructions (Signed)
We will check the labs today and get your records. We will call you back with the results.  I think that the supplement you have for weight is safe to try. If you do not get good results with it we can talk about safe options for weight loss.   We will have you see the rheumatologist to see if they can find good options for the ankle.

## 2015-03-25 NOTE — Progress Notes (Signed)
   Subjective:    Patient ID: Madison Gutierrez, female    DOB: 1946/08/08, 69 y.o.   MRN: [id number]  HPI The patient is a new 69 YO female who is coming in for left ankle pain and swelling for about 9 months. She does have history of psoriasis and has never seen a rheumatologist (just a dermatologist). She did not injure her foot prior to the pain and swelling. Worse with walking or standing. Still swollen in the morning but better. Did have injury remote to the ankle about 30 years ago. She does not take anything for her psoriasis although at one time she was advised to try embrel. Please see A/P for status and treatment of her chronic medical problems. She is also concerned about weight gain in the last several years.   PMH, [facility], social history reviewed and updated with the patient.   Review of Systems  Constitutional: Positive for activity change and unexpected weight change. Negative for fever, appetite change and fatigue.  HENT: Negative.   Eyes: Negative.   Respiratory: Negative for cough, chest tightness, shortness of breath and wheezing.   Cardiovascular: Negative for chest pain, palpitations and leg swelling.  Gastrointestinal: Positive for diarrhea. Negative for nausea, abdominal pain, constipation and abdominal distention.       Chronic, stable  Musculoskeletal: Positive for myalgias and arthralgias.  Skin: Negative.   Neurological: Negative.   Psychiatric/Behavioral: Negative.       Objective:   Physical Exam  Constitutional: She is oriented to person, place, and time. She appears well-developed and well-nourished.  Overweight  HENT:  Head: Normocephalic and atraumatic.  Eyes: EOM are normal.  Neck: Normal range of motion.  Cardiovascular: Normal rate and regular rhythm.   Pulmonary/Chest: Effort normal and breath sounds normal. No respiratory distress. She has no wheezes.  Abdominal: Soft. She exhibits no distension. There is no tenderness. There is no rebound.    Musculoskeletal: She exhibits edema.  1+ edema in the left ankle with tenderness along the medial aspect by the heel. Not tender on the plantar surface.   Neurological: She is alert and oriented to person, place, and time. Coordination normal.  Skin: Skin is warm and dry.  Psychiatric: She has a normal mood and affect.   Filed Vitals:   03/25/15 1031  BP: 148/82  Pulse: 86  Temp: 97.8 F (36.6 C)  TempSrc: Oral  Resp: 18  Height: 5\' 4"  (1.626 m)  Weight: 207 lb (93.895 kg)  SpO2: 94%      Assessment & Plan:

## 2015-03-25 NOTE — Assessment & Plan Note (Signed)
Will review records to find baseline. She has seen GI and been tested for active hepatitis. Per her reports she had hepatitis as a child. Talked to her about limiting alcohol intake as her liver would be sensitive to that. She had been taking milk thistle but stopped several months ago.

## 2015-03-25 NOTE — Assessment & Plan Note (Signed)
Checking for complications with CMP, lipid panel, HgA1c. She does have hypertension which is a complication (when she was down weight she was able to be off medication). Talked to her about diet and exercise. She is not exercising right now due to her ankle pain and will work to resolve this.

## 2015-04-01 ENCOUNTER — Telehealth: Payer: Self-pay | Admitting: Internal Medicine

## 2015-04-01 NOTE — Telephone Encounter (Signed)
Patient stated that no one has called her to set up and appointment with the RHEUMATOLOGIST, please advise

## 2015-04-02 ENCOUNTER — Telehealth: Payer: Self-pay | Admitting: Internal Medicine

## 2015-04-02 NOTE — Telephone Encounter (Signed)
Received records from [facility] forwarded to Dr. Doug Sou 04/02/15 fbg.

## 2015-04-16 ENCOUNTER — Encounter: Payer: Self-pay | Admitting: Geriatric Medicine

## 2015-05-29 ENCOUNTER — Telehealth: Payer: Self-pay

## 2015-05-29 NOTE — Telephone Encounter (Signed)
Patient called to educate on Medicare Wellness apt. LVM for the patient to call back to educate and schedule for wellness visit.   

## 2015-07-09 ENCOUNTER — Ambulatory Visit (INDEPENDENT_AMBULATORY_CARE_PROVIDER_SITE_OTHER): Payer: Medicare Other | Admitting: Internal Medicine

## 2015-07-09 ENCOUNTER — Encounter: Payer: Self-pay | Admitting: Internal Medicine

## 2015-07-09 ENCOUNTER — Other Ambulatory Visit (INDEPENDENT_AMBULATORY_CARE_PROVIDER_SITE_OTHER): Payer: Medicare Other

## 2015-07-09 VITALS — BP 132/88 | HR 87 | Temp 98.3°F | Resp 14 | Ht 64.0 in | Wt 194.4 lb

## 2015-07-09 DIAGNOSIS — I10 Essential (primary) hypertension: Secondary | ICD-10-CM

## 2015-07-09 DIAGNOSIS — M79671 Pain in right foot: Secondary | ICD-10-CM | POA: Diagnosis not present

## 2015-07-09 DIAGNOSIS — K529 Noninfective gastroenteritis and colitis, unspecified: Secondary | ICD-10-CM | POA: Diagnosis not present

## 2015-07-09 DIAGNOSIS — R748 Abnormal levels of other serum enzymes: Secondary | ICD-10-CM

## 2015-07-09 LAB — COMPREHENSIVE METABOLIC PANEL
ALT: 188 U/L — AB (ref 0–35)
AST: 128 U/L — ABNORMAL HIGH (ref 0–37)
Albumin: 4.1 g/dL (ref 3.5–5.2)
Alkaline Phosphatase: 103 U/L (ref 39–117)
BILIRUBIN TOTAL: 0.6 mg/dL (ref 0.2–1.2)
BUN: 15 mg/dL (ref 6–23)
CALCIUM: 9.4 mg/dL (ref 8.4–10.5)
CO2: 29 mEq/L (ref 19–32)
Chloride: 103 mEq/L (ref 96–112)
Creatinine, Ser: 0.54 mg/dL (ref 0.40–1.20)
GFR: 118.58 mL/min (ref >60.00)
Glucose, Bld: 106 mg/dL — ABNORMAL HIGH (ref 70–99)
Potassium: 4.2 mEq/L (ref 3.5–5.1)
SODIUM: 138 meq/L (ref 135–145)
Total Protein: 7.3 g/dL (ref 6.0–8.3)

## 2015-07-09 NOTE — Assessment & Plan Note (Addendum)
BP initially severely elevated (she had fight with electronic device earlier in the day) but down to normal with recheck. Per her reports normal at home. Adjust as needed, checking kidney function today. On lisinopril 10 mg daily.

## 2015-07-09 NOTE — Assessment & Plan Note (Signed)
New problem which is gradually resolving. Given rehab stretches for plantar fasciitis which is likely the problem. Can continue using OTC pain medications. Talked to her on limit of 2 g tylenol for her.

## 2015-07-09 NOTE — Progress Notes (Signed)
   Subjective:    Patient ID: Madison Gutierrez, female    DOB: October 05, 1946, 69 y.o.   MRN: [id number]  HPI The patient is here for follow up of several conditions. She is following on her diarrhea (better recently with some dietary changes and losing weight still on purpose, still once a week or so with loose stools). She is also following up on her foot pain (overall better but had a relapse with plantar fasciitis in her right foot with recent trip all over the [place], has been doing stretches and improving gradually). No new concerns and no new medicines or side effects.   Review of Systems  Constitutional: Positive for activity change. Negative for fever, appetite change and fatigue.  HENT: Negative.   Eyes: Negative.   Respiratory: Negative for cough, chest tightness, shortness of breath and wheezing.   Cardiovascular: Negative for chest pain, palpitations and leg swelling.  Gastrointestinal: Positive for diarrhea. Negative for nausea, abdominal pain, constipation and abdominal distention.       Improved  Musculoskeletal: Positive for myalgias and arthralgias.  Skin: Negative.   Neurological: Negative.   Psychiatric/Behavioral: Negative.       Objective:   Physical Exam  Constitutional: She is oriented to person, place, and time. She appears well-developed and well-nourished.  Overweight  HENT:  Head: Normocephalic and atraumatic.  Eyes: EOM are normal.  Neck: Normal range of motion.  Cardiovascular: Normal rate and regular rhythm.   Pulmonary/Chest: Effort normal and breath sounds normal. No respiratory distress. She has no wheezes.  Abdominal: Soft. She exhibits no distension. There is no tenderness. There is no rebound.  Musculoskeletal: She exhibits no edema.  Right foot with tenderness along the plantar surface  Neurological: She is alert and oriented to person, place, and time. Coordination normal.  Skin: Skin is warm and dry.  Psychiatric: She has a normal mood and affect.    Filed Vitals:   07/09/15 1129 07/09/15 1153  BP: 196/98 132/88  Pulse: 87   Temp: 98.3 F (36.8 C)   TempSrc: Oral   Resp: 14   Height: 5\' 4"  (1.626 m)   Weight: 194 lb 6.4 oz (88.179 kg)   SpO2: 97%       Assessment & Plan:

## 2015-07-09 NOTE — Progress Notes (Signed)
Pre visit review using our clinic review tool, if applicable. No additional management support is needed unless otherwise documented below in the visit note. 

## 2015-07-09 NOTE — Assessment & Plan Note (Signed)
Checking for stability. She has resumed the milk thistle since last visit. Likely more helpful is the 6-7 pounds she has lost since last visit.

## 2015-07-09 NOTE — Assessment & Plan Note (Signed)
Doing better and no clear reason. Perhaps some of her dietary changes in her efforts to lose weight were contributing. Last time we talked about her soda intake as a possible source of the loose stools. She has cut back on these.

## 2015-07-09 NOTE — Patient Instructions (Signed)
We will check the liver tests today and call you back about the results.   We will not change the blood pressure medicine today but will have you watch the pressures at home and if they are still high call the office to let us know.  Plantar Fasciitis (Heel Spur Syndrome) with Rehab The plantar fascia is a fibrous, ligament-like, soft-tissue structure that spans the bottom of the foot. Plantar fasciitis is a condition that causes pain in the foot due to inflammation of the tissue. SYMPTOMS   Pain and tenderness on the underneath side of the foot.  Pain that worsens with standing or walking. CAUSES  Plantar fasciitis is caused by irritation and injury to the plantar fascia on the underneath side of the foot. Common mechanisms of injury include:  Direct trauma to bottom of the foot.  Damage to a small nerve that runs under the foot where the main fascia attaches to the heel bone.  Stress placed on the plantar fascia due to bone spurs. RISK INCREASES WITH:   Activities that place stress on the plantar fascia (running, jumping, pivoting, or cutting).  Poor strength and flexibility.  Improperly fitted shoes.  Tight calf muscles.  Flat feet.  Failure to warm-up properly before activity.  Obesity. PREVENTION  Warm up and stretch properly before activity.  Allow for adequate recovery between workouts.  Maintain physical fitness:  Strength, flexibility, and endurance.  Cardiovascular fitness.  Maintain a health body weight.  Avoid stress on the plantar fascia.  Wear properly fitted shoes, including arch supports for individuals who have flat feet. PROGNOSIS  If treated properly, then the symptoms of plantar fasciitis usually resolve without surgery. However, occasionally surgery is necessary. RELATED COMPLICATIONS   Recurrent symptoms that may result in a chronic condition.  Problems of the lower back that are caused by compensating for the injury, such as  limping.  Pain or weakness of the foot during push-off following surgery.  Chronic inflammation, scarring, and partial or complete fascia tear, occurring more often from repeated injections. TREATMENT  Treatment initially involves the use of ice and medication to help reduce pain and inflammation. The use of strengthening and stretching exercises may help reduce pain with activity, especially stretches of the Achilles tendon. These exercises may be performed at home or with a therapist. Your caregiver may recommend that you use heel cups of arch supports to help reduce stress on the plantar fascia. Occasionally, corticosteroid injections are given to reduce inflammation. If symptoms persist for greater than 6 months despite non-surgical (conservative), then surgery may be recommended.  MEDICATION   If pain medication is necessary, then nonsteroidal anti-inflammatory medications, such as aspirin and ibuprofen, or other minor pain relievers, such as acetaminophen, are often recommended.  Do not take pain medication within 7 days before surgery.  Prescription pain relievers may be given if deemed necessary by your caregiver. Use only as directed and only as much as you need.  Corticosteroid injections may be given by your caregiver. These injections should be reserved for the most serious cases, because they may only be given a certain number of times. HEAT AND COLD  Cold treatment (icing) relieves pain and reduces inflammation. Cold treatment should be applied for 10 to 15 minutes every 2 to 3 hours for inflammation and pain and immediately after any activity that aggravates your symptoms. Use ice packs or massage the area with a piece of ice (ice massage).  Heat treatment may be used prior to performing the stretching  and strengthening activities prescribed by your caregiver, physical therapist, or athletic trainer. Use a heat pack or soak the injury in warm water. SEEK IMMEDIATE MEDICAL CARE  IF:  Treatment seems to offer no benefit, or the condition worsens.  Any medications produce adverse side effects. EXERCISES RANGE OF MOTION (ROM) AND STRETCHING EXERCISES - Plantar Fasciitis (Heel Spur Syndrome) These exercises may help you when beginning to rehabilitate your injury. Your symptoms may resolve with or without further involvement from your physician, physical therapist or athletic trainer. While completing these exercises, remember:   Restoring tissue flexibility helps normal motion to return to the joints. This allows healthier, less painful movement and activity.  An effective stretch should be held for at least 30 seconds.  A stretch should never be painful. You should only feel a gentle lengthening or release in the stretched tissue. RANGE OF MOTION - Toe Extension, Flexion  Sit with your right / left leg crossed over your opposite knee.  Grasp your toes and gently pull them back toward the top of your foot. You should feel a stretch on the bottom of your toes and/or foot.  Hold this stretch for __________ seconds.  Now, gently pull your toes toward the bottom of your foot. You should feel a stretch on the top of your toes and or foot.  Hold this stretch for __________ seconds. Repeat __________ times. Complete this stretch __________ times per day.  RANGE OF MOTION - Ankle Dorsiflexion, Active Assisted  Remove shoes and sit on a chair that is preferably not on a carpeted surface.  Place right / left foot under knee. Extend your opposite leg for support.  Keeping your heel down, slide your right / left foot back toward the chair until you feel a stretch at your ankle or calf. If you do not feel a stretch, slide your bottom forward to the edge of the chair, while still keeping your heel down.  Hold this stretch for __________ seconds. Repeat __________ times. Complete this stretch __________ times per day.  STRETCH - Gastroc, Standing  Place hands on  wall.  Extend right / left leg, keeping the front knee somewhat bent.  Slightly point your toes inward on your back foot.  Keeping your right / left heel on the floor and your knee straight, shift your weight toward the wall, not allowing your back to arch.  You should feel a gentle stretch in the right / left calf. Hold this position for __________ seconds. Repeat __________ times. Complete this stretch __________ times per day. STRETCH - Soleus, Standing  Place hands on wall.  Extend right / left leg, keeping the other knee somewhat bent.  Slightly point your toes inward on your back foot.  Keep your right / left heel on the floor, bend your back knee, and slightly shift your weight over the back leg so that you feel a gentle stretch deep in your back calf.  Hold this position for __________ seconds. Repeat __________ times. Complete this stretch __________ times per day. STRETCH - Gastrocsoleus, Standing  Note: This exercise can place a lot of stress on your foot and ankle. Please complete this exercise only if specifically instructed by your caregiver.   Place the ball of your right / left foot on a step, keeping your other foot firmly on the same step.  Hold on to the wall or a rail for balance.  Slowly lift your other foot, allowing your body weight to press your heel down over  the edge of the step.  You should feel a stretch in your right / left calf.  Hold this position for __________ seconds.  Repeat this exercise with a slight bend in your right / left knee. Repeat __________ times. Complete this stretch __________ times per day.  STRENGTHENING EXERCISES - Plantar Fasciitis (Heel Spur Syndrome)  These exercises may help you when beginning to rehabilitate your injury. They may resolve your symptoms with or without further involvement from your physician, physical therapist or athletic trainer. While completing these exercises, remember:   Muscles can gain both the  endurance and the strength needed for everyday activities through controlled exercises.  Complete these exercises as instructed by your physician, physical therapist or athletic trainer. Progress the resistance and repetitions only as guided. STRENGTH - Towel Curls  Sit in a chair positioned on a non-carpeted surface.  Place your foot on a towel, keeping your heel on the floor.  Pull the towel toward your heel by only curling your toes. Keep your heel on the floor.  If instructed by your physician, physical therapist or athletic trainer, add ____________________ at the end of the towel. Repeat __________ times. Complete this exercise __________ times per day. STRENGTH - Ankle Inversion  Secure one end of a rubber exercise band/tubing to a fixed object (table, pole). Loop the other end around your foot just before your toes.  Place your fists between your knees. This will focus your strengthening at your ankle.  Slowly, pull your big toe up and in, making sure the band/tubing is positioned to resist the entire motion.  Hold this position for __________ seconds.  Have your muscles resist the band/tubing as it slowly pulls your foot back to the starting position. Repeat __________ times. Complete this exercises __________ times per day.  Document Released: 09/27/2005 Document Revised: 12/20/2011 Document Reviewed: 01/09/2009 [facility] Patient Information 2015 [place], [place]. This information is not intended to replace advice given to you by your health care provider. Make sure you discuss any questions you have with your health care provider.

## 2015-11-25 ENCOUNTER — Encounter: Payer: Self-pay | Admitting: Cardiovascular Disease

## 2015-12-02 ENCOUNTER — Ambulatory Visit (INDEPENDENT_AMBULATORY_CARE_PROVIDER_SITE_OTHER): Payer: Medicare Other | Admitting: Cardiovascular Disease

## 2015-12-02 ENCOUNTER — Encounter: Payer: Self-pay | Admitting: Cardiovascular Disease

## 2015-12-02 VITALS — BP 138/88 | HR 98 | Ht 64.0 in | Wt 190.8 lb

## 2015-12-02 DIAGNOSIS — I350 Nonrheumatic aortic (valve) stenosis: Secondary | ICD-10-CM | POA: Diagnosis not present

## 2015-12-02 DIAGNOSIS — I471 Supraventricular tachycardia: Secondary | ICD-10-CM

## 2015-12-02 DIAGNOSIS — I447 Left bundle-branch block, unspecified: Secondary | ICD-10-CM | POA: Diagnosis not present

## 2015-12-02 DIAGNOSIS — I10 Essential (primary) hypertension: Secondary | ICD-10-CM

## 2015-12-02 NOTE — Progress Notes (Signed)
Chief Complaint  Patient presents with  . Follow-up      History of Present Illness: 70 yo WF with h/o HTN, palpitations here today for cardiac follow up. I saw her as a new patient August 2012 for further evaluation of palpitations. She was seen by Dr. Modena Morrow on 05/19/11 and EKG with frequent PVCs. I arranged an echo and a 48 hour Holter monitor August 2012. Her echo showed normal LV size and function with EF of 70%. There was mild aortic valve disease with mild stenosis and regurgitation. Her Holter showed normal sinus rhythm at baseline with frequent PVCs and several runs of SVT (longest 104 beats at 177 beats per minute). She was started on Cardizem CD 120 mg po Qdaily but did not take this because she was feeling well. She has been taking Lisinopril for BP control. Last echo march 201  She is here today for follow up. She has had no palpitations, chest pain or SOB.   Primary Care Physician:  Modena Morrow   Past Medical History  Diagnosis Date  . Staph aureus infection 2007    rt. shin  . Hypertension   . Rheumatoid arthritis(714.0)   . Osteopenia 05/2011    t score -1.9  . Elevated LFTs 05/19/11  . Heart palpitations   . Hepatic steatosis 05/24/11  . Psoriasis   . Obesity   . Hepatitis, unspecified   . Asthma     mild asthma from dairy allergies  . Ulcer   . GERD (gastroesophageal reflux disease)   . SVT (supraventricular tachycardia) [facility])     Past Surgical History  Procedure Laterality Date  . Hernia repair  2003    bi-lat  repair   . Appendectomy  1998  . Tummy tuck  2003  . Ovarian cyst removal  1978    from left ovary   . Facial reconstruction surgery      Current Outpatient Prescriptions  Medication Sig Dispense Refill  . colchicine 0.6 MG tablet Take 0.6 mg by mouth daily.    Marland Kitchen lisinopril (PRINIVIL,ZESTRIL) 10 MG tablet TAKE 1 TABLET (10 MG TOTAL) BY MOUTH DAILY. 30 tablet 10  . MILK THISTLE PO Take 1 tablet by mouth daily.    . Multiple Vitamin (MULTIVITAMIN  PO) Take 1 capsule by mouth daily.     . Nutritional Supplements (NUTRITIONAL SUPPLEMENT PO) Take by mouth. coline liver detox     No current facility-administered medications for this visit.    Allergies  Allergen Reactions  . Dairy Aid [Lactase]   . Petrolatum-Zinc Oxide Rash  . Sulfa Antibiotics Rash    Social History   Social History  . Marital Status: Married    Spouse Name: N/A  . Number of Children: 3  . Years of Education: N/A   Occupational History  . retired    Social History Main Topics  . Smoking status: Never Smoker   . Smokeless tobacco: Never Used  . Alcohol Use: 8.4 oz/week    14 Glasses of wine per week     Comment: at least two glasses of wine sometimes more nightly  . Drug Use: No  . Sexual Activity:    Partners: Male    Birth Control/ Protection: Post-menopausal   Other Topics Concern  . Not on file   Social History Narrative    Family History  Problem Relation Age of Onset  . Hypertension Mother   . Heart disease Mother   . Hypertension Father   . Heart  disease Father   . Hypertension Brother   . Prostate cancer Brother   . Hypertension Maternal Grandmother   . Heart attack Mother   . Heart attack Father   . Colon cancer Neg Hx     Review of Systems:  As stated in the HPI and otherwise negative.   BP 138/88 mmHg  Pulse 98  Ht 5\' 4"  (1.626 m)  Wt 190 lb 12.8 oz (86.546 kg)  BMI 32.73 kg/m2  Physical Examination: General: Well developed, well nourished, NAD HEENT: OP clear, mucus membranes moist SKIN: warm, dry. No rashes. Neuro: No focal deficits Musculoskeletal: Muscle strength 5/5 all ext Psychiatric: Mood and affect normal Neck: No JVD, no carotid bruits, no thyromegaly, no lymphadenopathy. Lungs:Clear bilaterally, no wheezes, rhonci, crackles Cardiovascular: Regular rate and rhythm. No murmurs, gallops or rubs. Abdomen:Soft. Bowel sounds present. Non-tender.  Extremities: No lower extremity edema. Pulses are 2 + in the  bilateral DP/PT.  Echo 12/24/13: Left ventricle: The cavity size was normal. There was moderate concentric hypertrophy. Systolic function was normal. The estimated ejection fraction was in the range of 60% to 65%. Wall motion was normal; there were no regional wall motion abnormalities. Doppler parameters are consistent with abnormal left ventricular relaxation (grade 1 diastolic dysfunction). The E/e' ratio is >10, suggesting elevated LV filling pressure. - Aortic valve: Mildly calcified leaflets. Transvalvular velocity was minimally increased. There is a small subvalvular gradient. PEak and mean gradients across the valve are 17 and 11 mmHg, respectively. The calculated AVA is 2.0 cm2, consistent with very mild aortic stenosis. Mild regurgitation. Valve area: 1.98cm^2(VTI). Valve area: 2.19cm^2 (Vmax). - Mitral valve: Mildly thickened leaflets . Chordal sam. Mild regurgitation. - Inferior vena cava: The vessel was normal in size; the respirophasic diameter changes were in the normal range (= 50%); findings are consistent with normal central venous pressure. - Pericardium, extracardiac: There was no pericardial effusion.  EKG:  EKG is not ordered today. The ekg ordered today demonstrates   Recent Labs: 03/25/2015: TSH 1.14 07/09/2015: ALT 188*; BUN 15; Creatinine, Ser 0.54; Potassium 4.2; Sodium 138   Lipid Panel    Component Value Date/Time   CHOL 194 03/25/2015 1141   TRIG 139.0 03/25/2015 1141   HDL 45.00 03/25/2015 1141   CHOLHDL 4 03/25/2015 1141   VLDL 27.8 03/25/2015 1141   LDLCALC 121* 03/25/2015 1141     Wt Readings from Last 3 Encounters:  12/02/15 190 lb 12.8 oz (86.546 kg)  07/09/15 194 lb 6.4 oz (88.179 kg)  03/25/15 207 lb (93.895 kg)     Other studies Reviewed: Additional studies/ records that were reviewed today include: . Review of the above records demonstrates:    Assessment and Plan:   1. SVT: No recent  palpitations. No changes.   2. HTN: BP has been well controlled. She has been taking Lisinopril 10 mg po Qdaily only. No changes.   3. Aortic valve stenosis: mild AS by echo 2015. Repeat in March 2018.  4. LBBB: Chronic.    Current medicines are reviewed at length with the patient today.  The patient does not have concerns regarding medicines.  The following changes have been made:  no change  Labs/ tests ordered today include:   Orders Placed This Encounter  Procedures  . EKG 12-Lead  . Echocardiogram    Disposition:   FU with me in 12  months  Signed, Lauree Chandler, MD 12/02/2015 11:45 AM    [address],  [place]  [address] Phone: [phone]; Fax: [phone]

## 2015-12-02 NOTE — Patient Instructions (Signed)
Medication Instructions:  Your physician recommends that you continue on your current medications as directed. Please refer to the Current Medication list given to you today.   Labwork: none  Testing/Procedures: Your physician has requested that you have an echocardiogram. Echocardiography is a painless test that uses sound waves to create images of your heart. It provides your doctor with information about the size and shape of your heart and how well your heart's chambers and valves are working. This procedure takes approximately one hour. There are no restrictions for this.   Procedure. To be done in 12 months--week or so prior to appt with Dr. Angelena Form    Follow-Up: Your physician wants you to follow-up in: 12 months.  You will receive a reminder letter in the mail two months in advance. If you don't receive a letter, please call our office to schedule the follow-up appointment.   Any Other Special Instructions Will Be Listed Below (If Applicable).     If you need a refill on your cardiac medications before your next appointment, please call your pharmacy.

## 2015-12-15 ENCOUNTER — Other Ambulatory Visit: Payer: Self-pay | Admitting: *Deleted

## 2015-12-15 MED ORDER — LISINOPRIL 10 MG PO TABS: ORAL_TABLET | ORAL | Status: DC

## 2015-12-16 ENCOUNTER — Telehealth: Payer: Self-pay | Admitting: Cardiovascular Disease

## 2015-12-16 ENCOUNTER — Other Ambulatory Visit: Payer: Self-pay | Admitting: Nurse Practitioner

## 2015-12-16 NOTE — Telephone Encounter (Signed)
re

## 2015-12-16 NOTE — Telephone Encounter (Signed)
Pt did not need the previous encounter,her medicine was at the pharmacy.

## 2016-05-14 ENCOUNTER — Encounter: Payer: Self-pay | Admitting: Internal Medicine

## 2016-10-07 ENCOUNTER — Encounter: Payer: Self-pay | Admitting: Internal Medicine

## 2016-10-07 ENCOUNTER — Ambulatory Visit (INDEPENDENT_AMBULATORY_CARE_PROVIDER_SITE_OTHER): Payer: Medicare Other | Admitting: Internal Medicine

## 2016-10-07 ENCOUNTER — Other Ambulatory Visit (INDEPENDENT_AMBULATORY_CARE_PROVIDER_SITE_OTHER): Payer: Medicare Other

## 2016-10-07 VITALS — BP 150/84 | HR 87 | Temp 97.9°F | Resp 14 | Ht 64.0 in | Wt 188.4 lb

## 2016-10-07 DIAGNOSIS — K529 Noninfective gastroenteritis and colitis, unspecified: Secondary | ICD-10-CM | POA: Diagnosis not present

## 2016-10-07 DIAGNOSIS — R197 Diarrhea, unspecified: Secondary | ICD-10-CM

## 2016-10-07 DIAGNOSIS — R748 Abnormal levels of other serum enzymes: Secondary | ICD-10-CM

## 2016-10-07 LAB — CBC
HCT: 44.3 % (ref 36.0–46.0)
HEMOGLOBIN: 15.2 g/dL — AB (ref 12.0–15.0)
MCHC: 34.3 g/dL (ref 30.0–36.0)
MCV: 93.2 fl (ref 78.0–100.0)
Platelets: 175 10*3/uL (ref 150.0–400.0)
RBC: 4.75 Mil/uL (ref 3.87–5.11)
RDW: 14.4 % (ref 11.5–15.5)
WBC: 7.2 10*3/uL (ref 4.0–10.5)

## 2016-10-07 LAB — COMPREHENSIVE METABOLIC PANEL
ALK PHOS: 103 U/L (ref 39–117)
ALT: 111 U/L — ABNORMAL HIGH (ref 0–35)
AST: 138 U/L — ABNORMAL HIGH (ref 0–37)
Albumin: 3.9 g/dL (ref 3.5–5.2)
BUN: 10 mg/dL (ref 6–23)
CALCIUM: 8.8 mg/dL (ref 8.4–10.5)
CHLORIDE: 105 meq/L (ref 96–112)
CO2: 29 mEq/L (ref 19–32)
CREATININE: 0.58 mg/dL (ref 0.40–1.20)
GFR: 108.8 mL/min (ref >60.00)
Glucose, Bld: 107 mg/dL — ABNORMAL HIGH (ref 70–99)
POTASSIUM: 4.1 meq/L (ref 3.5–5.1)
SODIUM: 141 meq/L (ref 135–145)
Total Bilirubin: 0.6 mg/dL (ref 0.2–1.2)
Total Protein: 6.9 g/dL (ref 6.0–8.3)

## 2016-10-07 LAB — TSH: TSH: 1.49 u[IU]/mL (ref 0.35–4.50)

## 2016-10-07 LAB — T4, FREE: FREE T4: 0.81 ng/dL (ref 0.60–1.60)

## 2016-10-07 LAB — VITAMIN B12: Vitamin B-12: 470 pg/mL (ref 211–911)

## 2016-10-07 LAB — VITAMIN D 25 HYDROXY (VIT D DEFICIENCY, FRACTURES): VITD: 25.73 ng/mL — AB (ref 30.00–100.00)

## 2016-10-07 LAB — FERRITIN: Ferritin: 363.4 ng/mL — ABNORMAL HIGH (ref 10.0–291.0)

## 2016-10-07 MED ORDER — PANCRELIPASE (LIP-PROT-AMYL) 36000-114000 UNITS PO CPEP: 36000.0000 [IU] | ORAL_CAPSULE | Freq: Three times a day (TID) | ORAL | 6 refills | Status: DC

## 2016-10-07 NOTE — Progress Notes (Signed)
   Subjective:    Patient ID: Madison Gutierrez, female    DOB: 1946/09/10, 70 y.o.   MRN: [id number]  HPI The patient is a 70 YO female coming in for worsening diarrhea. She has struggled with diarrhea for some years, prior GI workup in 2012 not diagnostic (she had poor experience and would not return). She has never really taken anything for it except otc meds which are not helpful. She started having worsening problems about 4 months ago and finally came in when she had time. She is going 3-4 times per day, no blood in the loose bowel movement. Not liquid but not fully formed. No change to her diet and she has limited her diet strictly to mostly breads to help with symptoms. She is also getting pressure with eating large amounts of food so she is eating small meals. She even wakes up at night to defecate.  She also has had some abnormal liver enzymes in the past which were partially evaluated and never followed up on. Have been stable in the last several years.   Review of Systems  Constitutional: Positive for appetite change and fatigue. Negative for activity change, chills and unexpected weight change.  Respiratory: Negative.   Gastrointestinal: Positive for abdominal distention, abdominal pain, diarrhea and nausea. Negative for constipation and vomiting.  Musculoskeletal: Negative.   Skin: Negative.   Neurological: Negative.       Objective:   Physical Exam  Constitutional: She is oriented to person, place, and time. She appears well-developed and well-nourished. No distress.  HENT:  Head: Normocephalic and atraumatic.  Eyes: EOM are normal.  Neck: Normal range of motion.  Cardiovascular: Normal rate and regular rhythm.   Pulmonary/Chest: Effort normal and breath sounds normal.  Abdominal: Soft. Bowel sounds are normal. She exhibits no distension and no mass. There is tenderness. There is no rebound and no guarding.  Diffuse mild discomfort. No guarding or rebound. BS normal.     Neurological: She is alert and oriented to person, place, and time.  Skin: Skin is warm and dry.   Vitals:   10/07/16 1109  BP: (!) 150/84  Pulse: 87  Resp: 14  Temp: 97.9 F (36.6 C)  TempSrc: Oral  SpO2: 98%  Weight: 188 lb 6 oz (85.4 kg)  Height: 5\' 4"  (1.626 m)      Assessment & Plan:

## 2016-10-07 NOTE — Progress Notes (Signed)
Pre visit review using our clinic review tool, if applicable. No additional management support is needed unless otherwise documented below in the visit note. 

## 2016-10-07 NOTE — Assessment & Plan Note (Signed)
Checking labs today for stability. Referral to GI as she is now willing to have evaluation. She was recommended to repeat hep B series back in 2012 and never wanted to do this.

## 2016-10-07 NOTE — Patient Instructions (Signed)
We are checking the labs today and will get you in with Dr. Collene Mares of GI to get more answers.   We have sent in a medicine called creon which can be helpful for the diarrhea. Take 1 pill with small meals or snacks and take 2 pills with large meals to help avoid the bloating and diarrhea.

## 2016-10-07 NOTE — Assessment & Plan Note (Signed)
Checking labs for CBC, CMP, TSH, B12. Referral to GI for evaluation. Rx for creon to see if there is some element of pancreatic insufficiency causing the diarrhea.

## 2016-10-13 ENCOUNTER — Telehealth: Payer: Self-pay | Admitting: Internal Medicine

## 2016-10-13 NOTE — Telephone Encounter (Signed)
Received call from [facility] that Dr Collene Mares declined to see pt. Not taking any new medicare patients. Spoke with pt and tried to explain to her that we could refer her to another doctor but that I didn't know that much about them but we have referred pt to them before. And that she could call her insurance to find out who was in network with her insurance. Pt said she didn't want to go to any one  that's why she wanted to see Dr Collene Mares because she was referred to her by someone, Then she hung up on me.

## 2016-11-05 DIAGNOSIS — R1013 Epigastric pain: Secondary | ICD-10-CM | POA: Diagnosis not present

## 2016-11-05 DIAGNOSIS — R945 Abnormal results of liver function studies: Secondary | ICD-10-CM | POA: Diagnosis not present

## 2016-11-05 DIAGNOSIS — K760 Fatty (change of) liver, not elsewhere classified: Secondary | ICD-10-CM | POA: Diagnosis not present

## 2016-11-05 DIAGNOSIS — R197 Diarrhea, unspecified: Secondary | ICD-10-CM | POA: Diagnosis not present

## 2016-11-10 DIAGNOSIS — R197 Diarrhea, unspecified: Secondary | ICD-10-CM | POA: Diagnosis not present

## 2016-11-22 DIAGNOSIS — K5730 Diverticulosis of large intestine without perforation or abscess without bleeding: Secondary | ICD-10-CM | POA: Diagnosis not present

## 2016-11-22 DIAGNOSIS — R12 Heartburn: Secondary | ICD-10-CM | POA: Diagnosis not present

## 2016-11-22 DIAGNOSIS — K2930 Chronic superficial gastritis without bleeding: Secondary | ICD-10-CM | POA: Diagnosis not present

## 2016-11-22 DIAGNOSIS — K52838 Other microscopic colitis: Secondary | ICD-10-CM | POA: Diagnosis not present

## 2016-11-22 DIAGNOSIS — R197 Diarrhea, unspecified: Secondary | ICD-10-CM | POA: Diagnosis not present

## 2016-11-22 DIAGNOSIS — R1013 Epigastric pain: Secondary | ICD-10-CM | POA: Diagnosis not present

## 2016-11-22 LAB — HM COLONOSCOPY

## 2016-11-26 DIAGNOSIS — K52838 Other microscopic colitis: Secondary | ICD-10-CM | POA: Diagnosis not present

## 2016-11-26 DIAGNOSIS — K2930 Chronic superficial gastritis without bleeding: Secondary | ICD-10-CM | POA: Diagnosis not present

## 2016-12-07 ENCOUNTER — Other Ambulatory Visit: Payer: Self-pay | Admitting: Cardiovascular Disease

## 2016-12-08 ENCOUNTER — Ambulatory Visit (HOSPITAL_COMMUNITY): Payer: Medicare Other

## 2016-12-15 DIAGNOSIS — K52831 Collagenous colitis: Secondary | ICD-10-CM | POA: Diagnosis not present

## 2016-12-15 DIAGNOSIS — R945 Abnormal results of liver function studies: Secondary | ICD-10-CM | POA: Diagnosis not present

## 2016-12-15 DIAGNOSIS — K2930 Chronic superficial gastritis without bleeding: Secondary | ICD-10-CM | POA: Diagnosis not present

## 2016-12-15 DIAGNOSIS — K760 Fatty (change of) liver, not elsewhere classified: Secondary | ICD-10-CM | POA: Diagnosis not present

## 2016-12-23 ENCOUNTER — Telehealth: Payer: Self-pay

## 2016-12-23 ENCOUNTER — Ambulatory Visit (HOSPITAL_COMMUNITY): Payer: Medicare Other | Attending: Cardiovascular Disease

## 2016-12-23 ENCOUNTER — Other Ambulatory Visit: Payer: Self-pay

## 2016-12-23 DIAGNOSIS — I340 Nonrheumatic mitral (valve) insufficiency: Secondary | ICD-10-CM | POA: Diagnosis not present

## 2016-12-23 DIAGNOSIS — I358 Other nonrheumatic aortic valve disorders: Secondary | ICD-10-CM | POA: Insufficient documentation

## 2016-12-23 DIAGNOSIS — I501 Left ventricular failure: Secondary | ICD-10-CM | POA: Diagnosis not present

## 2016-12-23 DIAGNOSIS — I517 Cardiomegaly: Secondary | ICD-10-CM | POA: Diagnosis not present

## 2016-12-23 DIAGNOSIS — I350 Nonrheumatic aortic (valve) stenosis: Secondary | ICD-10-CM

## 2016-12-23 MED ORDER — LISINOPRIL 10 MG PO TABS: 10.0000 mg | ORAL_TABLET | Freq: Every day | ORAL | 0 refills | Status: DC

## 2016-12-23 NOTE — Telephone Encounter (Signed)
Triage was called and asked to speak with the patient. The patient is currently in the office receiving an echo. The patient states that she started a new gastritis medication called Budesonide 3mg /24 hour cap: 3 caps/day. The patient states that since starting this medication that her BP has been elevated. She has a log of her BPs. The highest one is 147/93. They are mainly 130s/80s. She states the only other symptom she is experiencing is a HA with it. The patient takes lisinopril 10 mg daily. Patient is due for her yearly follow up. Reviewed with Dr. Angelena Form. Dr. Angelena Form states that her Bps are not terrible and that she needs an OV with him. Patient was advised to make an appointment with checkout once her echocardiogram was complete. The patient was advised to call us back if her BP worsens or if she develops other symptoms between now and her important. 30 day supply of her lisinopril sent to the patient's preferred pharmacy to get patient through until her follow up with Dr. Angelena Form. Patient verbalized understanding.

## 2016-12-29 ENCOUNTER — Encounter: Payer: Self-pay | Admitting: Internal Medicine

## 2016-12-29 NOTE — Progress Notes (Unsigned)
Results entered and sent to scan  

## 2017-01-07 ENCOUNTER — Encounter: Payer: Self-pay | Admitting: Cardiology

## 2017-01-07 ENCOUNTER — Ambulatory Visit (INDEPENDENT_AMBULATORY_CARE_PROVIDER_SITE_OTHER): Payer: Medicare Other | Admitting: Cardiology

## 2017-01-07 VITALS — BP 158/88 | HR 78 | Ht 64.0 in | Wt 190.8 lb

## 2017-01-07 DIAGNOSIS — I10 Essential (primary) hypertension: Secondary | ICD-10-CM | POA: Diagnosis not present

## 2017-01-07 MED ORDER — LISINOPRIL 20 MG PO TABS: 20.0000 mg | ORAL_TABLET | Freq: Every day | ORAL | 3 refills | Status: DC

## 2017-01-07 NOTE — Progress Notes (Signed)
01/07/2017 Madison Gutierrez   1946/05/13  [id number]  Primary Physician Hoyt Koch, MD Primary Cardiologist: Dr. Angelena Form    Reason for Visit/CC: F/u for Aortic Valve Disease, HTN and h/o SVT  HPI:  The patient is a 71 year old female who has been followed by Dr. Angelena Form. She presents to clinic today for routien follow-up. Her last office visit was 12/02/2015.   She first established care in our practice in 2012 after her PCP referred her for evaluation for PVCs. She was ordered to wear a 48-hour Holter monitor. This showed normal sinus rhythm at baseline with frequent PVCs and several runs of SVT (longest 104 beats at 177 beats per minute). Her echocardiogram showed normal left ventricular size and function with an EF of 70%. She was started on Cardizem CD, 120 mg daily, however she self discontinued this due to intolerances. She also has a history of hypertension which has been treated with lisinopril. In addition, she has mild aortic valve disease. She recently had a f/u 2D echo on 12/23/16. This showed small AV gradient sclerosis, no stenosis and no LVOT gradient . EF normal at 60-65%.   She also has colitis and her GI specialist recently placed her on a steroid for recent flare. She will be on this for several months. Since starting it, she has noticed an increase in her BP. BP in clinic today is 158/88. EKG shows chronic LBBB.   She denies CP. No dyspnea, LEE, orthopnea or PND.    Current Meds  Medication Sig  . budesonide (ENTOCORT EC) 3 MG 24 hr capsule Take 3 mg by mouth.  . colchicine 0.6 MG tablet Take 0.6 mg by mouth daily.  . lipase/protease/amylase (CREON) 36000 UNITS CPEP capsule Take 1 capsule (36,000 Units total) by mouth 3 (three) times daily before meals.  . pantoprazole (PROTONIX) 40 MG tablet Take 40 mg by mouth.  . sucralfate (CARAFATE) 1 g tablet Take 1 g by mouth.  . [DISCONTINUED] lisinopril (PRINIVIL,ZESTRIL) 10 MG tablet Take 1 tablet (10 mg total) by  mouth daily. *Please call and schedule a one year follow up appointment*   Allergies  Allergen Reactions  . Dairy Aid [Lactase]   . Petrolatum-Zinc Oxide Rash  . Sulfa Antibiotics Rash  . Sulfamethoxazole Rash   Past Medical History:  Diagnosis Date  . Asthma    mild asthma from dairy allergies  . Elevated LFTs 05/19/11  . GERD (gastroesophageal reflux disease)   . Heart palpitations   . Hepatic steatosis 05/24/11  . Hepatitis, unspecified   . Hypertension   . Obesity   . Osteopenia 05/2011   t score -1.9  . Psoriasis   . Rheumatoid arthritis(714.0)   . Staph aureus infection 2007   rt. shin  . SVT (supraventricular tachycardia) ([place])   . Ulcer ([place])    Family History  Problem Relation Age of Onset  . Hypertension Mother   . Heart disease Mother   . Hypertension Father   . Heart disease Father   . Hypertension Brother   . Prostate cancer Brother   . Hypertension Maternal Grandmother   . Heart attack Mother   . Heart attack Father   . Colon cancer Neg Hx    Past Surgical History:  Procedure Laterality Date  . APPENDECTOMY  1998  . FACIAL RECONSTRUCTION SURGERY    . HERNIA REPAIR  2003   bi-lat  repair   . OVARIAN CYST REMOVAL  1978   from left ovary   .  tummy tuck  2003   Social History   Social History  . Marital status: Married    Spouse name: N/A  . Number of children: 3  . Years of education: N/A   Occupational History  . retired Retired   Social History Main Topics  . Smoking status: Never Smoker  . Smokeless tobacco: Never Used  . Alcohol use 8.4 oz/week    14 Glasses of wine per week     Comment: at least two glasses of wine sometimes more nightly  . Drug use: No  . Sexual activity: Not Currently    Partners: Male    Birth control/ protection: Post-menopausal   Other Topics Concern  . Not on file   Social History Narrative  . No narrative on file     Review of Systems: General: negative for chills, fever, night sweats or weight  changes.  Cardiovascular: negative for chest pain, dyspnea on exertion, edema, orthopnea, palpitations, paroxysmal nocturnal dyspnea or shortness of breath Dermatological: negative for rash Respiratory: negative for cough or wheezing Urologic: negative for hematuria Abdominal: negative for nausea, vomiting, diarrhea, bright red blood per rectum, melena, or hematemesis Neurologic: negative for visual changes, syncope, or dizziness All other systems reviewed and are otherwise negative except as noted above.   Physical Exam:  Blood pressure (!) 158/88, pulse 78, height 5\' 4"  (1.626 m), weight 190 lb 12.8 oz (86.5 kg), SpO2 95 %.  General appearance: alert, cooperative and no distress Neck: no carotid bruit and no JVD Lungs: clear to auscultation bilaterally Heart: regular rate and rhythm, S1, S2 normal, no murmur, click, rub or gallop Extremities: extremities normal, atraumatic, no cyanosis or edema Pulses: 2+ and symmetric Skin: Skin color, texture, turgor normal. No rashes or lesions Neurologic: Grossly normal  EKG chronic LBBB  2D Echo 12/23/16 Study Conclusions  - Left ventricle: The cavity size was normal. Wall thickness was   increased in a pattern of severe LVH. There was severe concentric   hypertrophy. Systolic function was normal. The estimated ejection   fraction was in the range of 60% to 65%. Wall motion was normal;   there were no regional wall motion abnormalities. Doppler   parameters are consistent with abnormal left ventricular   relaxation (grade 1 diastolic dysfunction). - Aortic valve: Small AV gradient sclerosis no stenosis and no LVOT   gradient recorded at current HR and filling pressues. Poorly   visualized. Mean gradient (S): 11 mm Hg. Valve area (VTI): 1.78   cm^2. - Mitral valve: There was trivial regurgitation and possible   chordal SAM - Left atrium: The atrium was mildly dilated. - Atrial septum: No defect or patent foramen ovale was identified. -  Pulmonary arteries: Systolic pressure could not be accurately   estimated. - Impressions: Small LVOT but no LVOT gradient.  Impressions:  - Small LVOT but no LVOT gradient.  ASSESSMENT AND PLAN:    1. H/o PVCs/ SVT: stable. no recent symptoms.   2. HTN: BP has increased since starting oral steroids for colitis flare. She will need to be on this for several months. She has no edema on exam. Her systolic BPs have ranged in the 140s-150s.  We will increase her lisinopril to 20 mg daily. She will monitor her BP at home and will notify us if no improvement.   3. Aortic Valve Disease: 2D echo on 12/23/16. This showed small AV gradient sclerosis, no stenosis and no LVOT gradient . EF normal at 60-65%.   4. LBBB:  chronic     F/u in 1 year with Dr. Claudie Revering PA-C 01/07/2017 4:27 PM

## 2017-01-07 NOTE — Patient Instructions (Signed)
Medication Instructions:  Increase lisinopril to 20 mg a day.  Labwork: None.  Testing/Procedures: None.  Follow-Up: Your physician wants you to follow-up in: 1 year with Dr. Angelena Form. You will receive a reminder letter in the mail two months in advance. If you don't receive a letter, please call our office to schedule the follow-up appointment.   Any Other Special Instructions Will Be Listed Below (If Applicable).     If you need a refill on your cardiac medications before your next appointment, please call your pharmacy.

## 2017-04-01 DIAGNOSIS — R7989 Other specified abnormal findings of blood chemistry: Secondary | ICD-10-CM | POA: Diagnosis not present

## 2017-04-01 DIAGNOSIS — K2930 Chronic superficial gastritis without bleeding: Secondary | ICD-10-CM | POA: Diagnosis not present

## 2017-04-01 DIAGNOSIS — K760 Fatty (change of) liver, not elsewhere classified: Secondary | ICD-10-CM | POA: Diagnosis not present

## 2017-04-01 DIAGNOSIS — K52831 Collagenous colitis: Secondary | ICD-10-CM | POA: Diagnosis not present

## 2017-04-21 DIAGNOSIS — H524 Presbyopia: Secondary | ICD-10-CM | POA: Diagnosis not present

## 2017-10-12 DIAGNOSIS — H2513 Age-related nuclear cataract, bilateral: Secondary | ICD-10-CM | POA: Diagnosis not present

## 2017-10-12 DIAGNOSIS — H01005 Unspecified blepharitis left lower eyelid: Secondary | ICD-10-CM | POA: Diagnosis not present

## 2017-10-12 DIAGNOSIS — H4312 Vitreous hemorrhage, left eye: Secondary | ICD-10-CM | POA: Diagnosis not present

## 2017-10-12 DIAGNOSIS — H01004 Unspecified blepharitis left upper eyelid: Secondary | ICD-10-CM | POA: Diagnosis not present

## 2017-10-12 DIAGNOSIS — H33312 Horseshoe tear of retina without detachment, left eye: Secondary | ICD-10-CM | POA: Diagnosis not present

## 2017-10-12 DIAGNOSIS — H01002 Unspecified blepharitis right lower eyelid: Secondary | ICD-10-CM | POA: Diagnosis not present

## 2017-10-12 DIAGNOSIS — H01001 Unspecified blepharitis right upper eyelid: Secondary | ICD-10-CM | POA: Diagnosis not present

## 2017-10-12 DIAGNOSIS — H43813 Vitreous degeneration, bilateral: Secondary | ICD-10-CM | POA: Diagnosis not present

## 2017-10-20 ENCOUNTER — Other Ambulatory Visit (INDEPENDENT_AMBULATORY_CARE_PROVIDER_SITE_OTHER): Payer: Medicare Other

## 2017-10-20 ENCOUNTER — Encounter: Payer: Self-pay | Admitting: Internal Medicine

## 2017-10-20 ENCOUNTER — Ambulatory Visit: Payer: Medicare Other | Admitting: Internal Medicine

## 2017-10-20 VITALS — BP 138/90 | HR 96 | Temp 97.8°F | Ht 64.0 in | Wt 180.0 lb

## 2017-10-20 DIAGNOSIS — M545 Low back pain, unspecified: Secondary | ICD-10-CM

## 2017-10-20 DIAGNOSIS — Z0000 Encounter for general adult medical examination without abnormal findings: Secondary | ICD-10-CM

## 2017-10-20 LAB — LIPID PANEL
Cholesterol: 186 mg/dL (ref 0–200)
HDL: 55.4 mg/dL (ref >39.00)
LDL CALC: 110 mg/dL — AB (ref 0–99)
NONHDL: 130.49
Total CHOL/HDL Ratio: 3
Triglycerides: 101 mg/dL (ref 0.0–149.0)
VLDL: 20.2 mg/dL (ref 0.0–40.0)

## 2017-10-20 LAB — COMPREHENSIVE METABOLIC PANEL
ALBUMIN: 4.1 g/dL (ref 3.5–5.2)
ALT: 48 U/L — AB (ref 0–35)
AST: 53 U/L — AB (ref 0–37)
Alkaline Phosphatase: 80 U/L (ref 39–117)
BILIRUBIN TOTAL: 0.5 mg/dL (ref 0.2–1.2)
BUN: 14 mg/dL (ref 6–23)
CALCIUM: 9.1 mg/dL (ref 8.4–10.5)
CHLORIDE: 102 meq/L (ref 96–112)
CO2: 32 meq/L (ref 19–32)
CREATININE: 0.56 mg/dL (ref 0.40–1.20)
GFR: 112.97 mL/min (ref >60.00)
Glucose, Bld: 105 mg/dL — ABNORMAL HIGH (ref 70–99)
Potassium: 4.1 mEq/L (ref 3.5–5.1)
SODIUM: 141 meq/L (ref 135–145)
Total Protein: 7.3 g/dL (ref 6.0–8.3)

## 2017-10-20 LAB — CBC
HCT: 45.2 % (ref 36.0–46.0)
Hemoglobin: 15.1 g/dL — ABNORMAL HIGH (ref 12.0–15.0)
MCHC: 33.4 g/dL (ref 30.0–36.0)
MCV: 94.6 fl (ref 78.0–100.0)
PLATELETS: 179 10*3/uL (ref 150.0–400.0)
RBC: 4.78 Mil/uL (ref 3.87–5.11)
RDW: 14 % (ref 11.5–15.5)
WBC: 6.7 10*3/uL (ref 4.0–10.5)

## 2017-10-20 LAB — HEMOGLOBIN A1C: Hgb A1c MFr Bld: 5.4 % (ref 4.6–6.5)

## 2017-10-20 NOTE — Progress Notes (Signed)
   Subjective:    Patient ID: Madison Gutierrez, female    DOB: [birth year], 72 y.o.   MRN: [id number]  HPI The patient is a 72 YO female coming in for low back pain. Started several weeks ago when she was getting christmas things out of the attic. She has stopped lifting things since that time. Overall improving gradually. Has tried otc medications for the pain with reasonable relief. Denies radiation of the pain. Denies numbness or weakness. Denies bowel or bladder change.  Review of Systems  Constitutional: Positive for activity change. Negative for appetite change, chills, fatigue, fever and unexpected weight change.  Respiratory: Negative.   Cardiovascular: Negative.   Gastrointestinal: Negative.   Musculoskeletal: Positive for arthralgias, back pain and myalgias. Negative for gait problem and joint swelling.  Skin: Negative.   Neurological: Negative.       Objective:   Physical Exam  Constitutional: She is oriented to person, place, and time. She appears well-developed and well-nourished.  HENT:  Head: Normocephalic and atraumatic.  Eyes: EOM are normal.  Neck: Normal range of motion.  Cardiovascular: Normal rate and regular rhythm.  Pulmonary/Chest: Effort normal and breath sounds normal. No respiratory distress. She has no wheezes. She has no rales.  Abdominal: Soft.  Musculoskeletal: She exhibits tenderness. She exhibits no edema.  Tenderness left lumbar paraspinal  Neurological: She is alert and oriented to person, place, and time. Coordination normal.  Skin: Skin is warm and dry.   Vitals:   10/20/17 1312  BP: 138/90  Pulse: 96  Temp: 97.8 F (36.6 C)  TempSrc: Oral  SpO2: 99%  Weight: 180 lb (81.6 kg)  Height: 5\' 4"  (1.626 m)      Assessment & Plan:

## 2017-10-20 NOTE — Patient Instructions (Signed)
We will check the labs today.  It is okay to use the advil for the back.

## 2017-10-21 DIAGNOSIS — H33312 Horseshoe tear of retina without detachment, left eye: Secondary | ICD-10-CM | POA: Diagnosis not present

## 2017-10-21 DIAGNOSIS — M545 Low back pain, unspecified: Secondary | ICD-10-CM | POA: Insufficient documentation

## 2017-10-21 NOTE — Assessment & Plan Note (Signed)
Suspect musculoskeletal etiology and overall resolving gradually. She wishes to take tylenol as needed. If not improving will get x-ray of lumbar. Given stretching exercises to help.

## 2017-11-04 DIAGNOSIS — H43812 Vitreous degeneration, left eye: Secondary | ICD-10-CM | POA: Diagnosis not present

## 2017-11-04 DIAGNOSIS — H01001 Unspecified blepharitis right upper eyelid: Secondary | ICD-10-CM | POA: Diagnosis not present

## 2017-11-04 DIAGNOSIS — H4312 Vitreous hemorrhage, left eye: Secondary | ICD-10-CM | POA: Diagnosis not present

## 2017-11-04 DIAGNOSIS — H33312 Horseshoe tear of retina without detachment, left eye: Secondary | ICD-10-CM | POA: Diagnosis not present

## 2018-03-10 ENCOUNTER — Other Ambulatory Visit: Payer: Self-pay | Admitting: Cardiology

## 2018-03-23 DIAGNOSIS — H43393 Other vitreous opacities, bilateral: Secondary | ICD-10-CM | POA: Diagnosis not present

## 2018-03-23 DIAGNOSIS — H43813 Vitreous degeneration, bilateral: Secondary | ICD-10-CM | POA: Diagnosis not present

## 2018-03-23 DIAGNOSIS — H3562 Retinal hemorrhage, left eye: Secondary | ICD-10-CM | POA: Diagnosis not present

## 2018-03-23 DIAGNOSIS — H4312 Vitreous hemorrhage, left eye: Secondary | ICD-10-CM | POA: Diagnosis not present

## 2018-05-02 DIAGNOSIS — H43391 Other vitreous opacities, right eye: Secondary | ICD-10-CM | POA: Diagnosis not present

## 2018-05-02 DIAGNOSIS — H4312 Vitreous hemorrhage, left eye: Secondary | ICD-10-CM | POA: Diagnosis not present

## 2018-05-02 DIAGNOSIS — H43813 Vitreous degeneration, bilateral: Secondary | ICD-10-CM | POA: Diagnosis not present

## 2018-05-02 DIAGNOSIS — H2513 Age-related nuclear cataract, bilateral: Secondary | ICD-10-CM | POA: Diagnosis not present

## 2018-05-05 ENCOUNTER — Ambulatory Visit: Payer: Medicare Other | Admitting: Family Medicine

## 2018-05-05 ENCOUNTER — Encounter: Payer: Self-pay | Admitting: Family Medicine

## 2018-05-05 VITALS — BP 170/100 | HR 92 | Ht 64.0 in | Wt 174.0 lb

## 2018-05-05 DIAGNOSIS — M79601 Pain in right arm: Secondary | ICD-10-CM

## 2018-05-05 DIAGNOSIS — M79641 Pain in right hand: Secondary | ICD-10-CM | POA: Insufficient documentation

## 2018-05-05 MED ORDER — PREDNISONE 5 MG PO TABS: ORAL_TABLET | ORAL | 0 refills | Status: DC

## 2018-05-05 NOTE — Progress Notes (Signed)
Madison Gutierrez - 72 y.o. female MRN [id number]  Date of birth: 06/23/1946  SUBJECTIVE:  Including CC & ROS.  No chief complaint on file.   Madison Gutierrez is a 72 y.o. female that is presenting with right arm pain.  The pain is extending from her scapula down the posterior aspect of her arm into her hand.  This pain is been occurring for a couple of days.  The pain is worse in any position.  She has been working outside in her yard more frequently lately.  She has not taken anything for the pain.  The pain is constant.  The pain is moderate to severe in nature.  Denies any numbness or tingling.  She describes as a burning sensation.  No history of similar pain.    Review of Systems  Constitutional: Negative for fever.  HENT: Negative for congestion.   Respiratory: Negative for cough.   Cardiovascular: Negative for chest pain.  Gastrointestinal: Negative for abdominal pain.  Musculoskeletal: Negative for gait problem.  Neurological: Negative for weakness.  Hematological: Negative for adenopathy.  Psychiatric/Behavioral: Negative for agitation.    HISTORY: Past Medical, Surgical, Social, and Family History Reviewed & Updated per EMR.   Pertinent Historical Findings include:  Past Medical History:  Diagnosis Date  . Asthma    mild asthma from dairy allergies  . Elevated LFTs 05/19/11  . GERD (gastroesophageal reflux disease)   . Heart palpitations   . Hepatic steatosis 05/24/11  . Hepatitis, unspecified   . Hypertension   . Obesity   . Osteopenia 05/2011   t score -1.9  . Psoriasis   . Rheumatoid arthritis(714.0)   . Staph aureus infection 2007   rt. shin  . SVT (supraventricular tachycardia) ([place])   . Ulcer     Past Surgical History:  Procedure Laterality Date  . APPENDECTOMY  1998  . FACIAL RECONSTRUCTION SURGERY    . HERNIA REPAIR  2003   bi-lat  repair   . OVARIAN CYST REMOVAL  1978   from left ovary   . tummy tuck  2003    Allergies  Allergen Reactions  . Dairy Aid  [Lactase]   . Petrolatum-Zinc Oxide Rash  . Sulfa Antibiotics Rash  . Sulfamethoxazole Rash    Family History  Problem Relation Age of Onset  . Hypertension Father   . Heart disease Father   . Heart attack Father   . Hypertension Mother   . Heart disease Mother   . Heart attack Mother   . Hypertension Brother   . Prostate cancer Brother   . Hypertension Maternal Grandmother   . Colon cancer Neg Hx      Social History   Socioeconomic History  . Marital status: Married    Spouse name: Not on file  . Number of children: 3  . Years of education: Not on file  . Highest education level: Not on file  Occupational History  . Occupation: retired    Fish farm manager: RETIRED  Social Needs  . Financial resource strain: Not on file  . Food insecurity:    Worry: Not on file    Inability: Not on file  . Transportation needs:    Medical: Not on file    Non-medical: Not on file  Tobacco Use  . Smoking status: Never Smoker  . Smokeless tobacco: Never Used  Substance and Sexual Activity  . Alcohol use: Yes    Alcohol/week: 8.4 oz    Types: 14 Glasses of wine per week  Comment: at least two glasses of wine sometimes more nightly  . Drug use: No  . Sexual activity: Not Currently    Partners: Male    Birth control/protection: Post-menopausal  Lifestyle  . Physical activity:    Days per week: Not on file    Minutes per session: Not on file  . Stress: Not on file  Relationships  . Social connections:    Talks on phone: Not on file    Gets together: Not on file    Attends religious service: Not on file    Active member of club or organization: Not on file    Attends meetings of clubs or organizations: Not on file    Relationship status: Not on file  . Intimate partner violence:    Fear of current or ex partner: Not on file    Emotionally abused: Not on file    Physically abused: Not on file    Forced sexual activity: Not on file  Other Topics Concern  . Not on file  Social  History Narrative  . Not on file     PHYSICAL EXAM:  VS: BP (!) 170/100 (BP Location: Left Arm, Patient Position: Sitting, Cuff Size: Normal)   Pulse 92   Ht 5\' 4"  (1.626 m)   Wt 174 lb (78.9 kg)   SpO2 96%   BMI 29.87 kg/m  Physical Exam Gen: NAD, alert, cooperative with exam, well-appearing ENT: normal lips, normal nasal mucosa,  Eye: normal EOM, normal conjunctiva and lids CV:  no edema, +2 pedal pulses   Resp: no accessory muscle use, non-labored,  Skin: no rashes, no areas of induration  Neuro: normal tone, normal sensation to touch Psych:  normal insight, alert and oriented MSK:  Right Shoulder: Tenderness palpation over the mid scapula Inspection reveals no abnormalities, atrophy or asymmetry. Palpation is normal with no tenderness over AC joint . Pain with passive flexion and abduction. Pain with external rotation. Normal passive flexion abduction and external rotation Rotator cuff strength normal throughout. Negative Hawkin's tests, empty can sign. Neurovascularly intact     ASSESSMENT & PLAN:   Right arm pain Pain seems radicular in nature but also has tender points over the scapula.  Has been working outside so possible for the trigger.  Likely has some spasms in the trapezius and upper back. -Prednisone. -Counseled on home exercise therapy. -If no improvement may need to consider trigger point injections or gabapentin.

## 2018-05-05 NOTE — Assessment & Plan Note (Signed)
Pain seems radicular in nature but also has tender points over the scapula.  Has been working outside so possible for the trigger.  Likely has some spasms in the trapezius and upper back. -Prednisone. -Counseled on home exercise therapy. -If no improvement may need to consider trigger point injections or gabapentin.

## 2018-05-05 NOTE — Patient Instructions (Signed)
Nice to meet you  Please try the prednisone  Please try the exercises  Please try a thera cane  Please follow up with me in 3-4 weeks if there is no improvement.

## 2018-05-10 DIAGNOSIS — H4312 Vitreous hemorrhage, left eye: Secondary | ICD-10-CM | POA: Diagnosis not present

## 2018-05-10 DIAGNOSIS — H01001 Unspecified blepharitis right upper eyelid: Secondary | ICD-10-CM | POA: Diagnosis not present

## 2018-05-10 DIAGNOSIS — H43812 Vitreous degeneration, left eye: Secondary | ICD-10-CM | POA: Diagnosis not present

## 2018-05-10 DIAGNOSIS — H33312 Horseshoe tear of retina without detachment, left eye: Secondary | ICD-10-CM | POA: Diagnosis not present

## 2018-05-16 ENCOUNTER — Other Ambulatory Visit: Payer: Self-pay | Admitting: Cardiology

## 2018-05-29 ENCOUNTER — Ambulatory Visit: Payer: Medicare Other | Admitting: Physician Assistant

## 2018-06-01 ENCOUNTER — Encounter: Payer: Self-pay | Admitting: Physician Assistant

## 2018-06-01 ENCOUNTER — Ambulatory Visit (INDEPENDENT_AMBULATORY_CARE_PROVIDER_SITE_OTHER): Payer: Medicare Other | Admitting: Physician Assistant

## 2018-06-01 VITALS — BP 142/86 | HR 79 | Ht 63.0 in | Wt 176.2 lb

## 2018-06-01 DIAGNOSIS — R21 Rash and other nonspecific skin eruption: Secondary | ICD-10-CM

## 2018-06-01 DIAGNOSIS — I471 Supraventricular tachycardia: Secondary | ICD-10-CM | POA: Diagnosis not present

## 2018-06-01 DIAGNOSIS — I10 Essential (primary) hypertension: Secondary | ICD-10-CM | POA: Diagnosis not present

## 2018-06-01 DIAGNOSIS — I359 Nonrheumatic aortic valve disorder, unspecified: Secondary | ICD-10-CM | POA: Diagnosis not present

## 2018-06-01 DIAGNOSIS — I493 Ventricular premature depolarization: Secondary | ICD-10-CM

## 2018-06-01 MED ORDER — DILTIAZEM HCL ER COATED BEADS 120 MG PO CP24: 120.0000 mg | ORAL_CAPSULE | Freq: Every day | ORAL | 3 refills | Status: DC

## 2018-06-01 NOTE — Progress Notes (Signed)
Cardiology Office Note    Date:  06/01/2018   ID:  Madison Gutierrez, DOB 1946-09-10, MRN [id number]  PCP:  Hoyt Koch, MD  Cardiologist:  Dr. Angelena Form   Chief Complaint: 16 Months follow up  History of Present Illness:   Madison Gutierrez is a 72 y.o. female with hx of Aortic valve disease, HTN, chronic LBBB and  SVT presents for medications refills.   She first established care in our practice in 2012 after her PCP referred her for evaluation for PVCs. She was ordered to wear a 48-hour Holter monitor. This showed normal sinus rhythm at baseline with frequent PVCs and several runs of SVT (longest 104 beats at 177 beats per minute). Her echocardiogram showed normal left ventricular size and function with an EF of 70%. She was started on Cardizem CD, 120 mg daily, however she self discontinued this due to intolerances. She also has a history of hypertension which has been treated with lisinopril. In addition, she has mild aortic valve disease.  2D echo on 12/23/16 showed small AV gradient sclerosis, no stenosis and no LVOTgradient . EF normal at 60-65%.   She was doing well on cardiac stand point when last seen by APP 12/2016.  Here today for medication refill. Patient has rash all over her body for approximately 4-6 months ago. [place] Daughter found tick on back afterwards. Seen by PCP and given steroid without improvement. Itchy all over. benadryl help some. No chest pain, SOB, orthopnea, PND, LE edema or melena. Intermittently noted skipping beats and heart racing. No associated symptoms.   Past Medical History:  Diagnosis Date  . Asthma    mild asthma from dairy allergies  . Elevated LFTs 05/19/11  . GERD (gastroesophageal reflux disease)   . Heart palpitations   . Hepatic steatosis 05/24/11  . Hepatitis, unspecified   . Hypertension   . Obesity   . Osteopenia 05/2011   t score -1.9  . Psoriasis   . Rheumatoid arthritis(714.0)   . Staph aureus infection 2007   rt.  shin  . SVT (supraventricular tachycardia) ([place])   . Ulcer     Past Surgical History:  Procedure Laterality Date  . APPENDECTOMY  1998  . FACIAL RECONSTRUCTION SURGERY    . HERNIA REPAIR  2003   bi-lat  repair   . OVARIAN CYST REMOVAL  1978   from left ovary   . tummy tuck  2003    Current Medications: Prior to Admission medications   Medication Sig Start Date End Date Taking? Authorizing Provider  lisinopril (PRINIVIL,ZESTRIL) 20 MG tablet Take 1 tablet (20 mg total) by mouth daily. Please keep upcoming appt in August before anymore refills. Thank you 05/16/18   Lyda Jester M, PA-C  predniSONE (DELTASONE) 5 MG tablet Take 6 pills for first day, 5 pills second day, 4 pills third day, 3 pills fourth day, 2 pills the fifth day, and 1 pill sixth day. 05/05/18   Rosemarie Ax, MD    Allergies:   Dairy aid [lactase]; Petrolatum-zinc oxide; Sulfa antibiotics; and Sulfamethoxazole   Social History   Socioeconomic History  . Marital status: Married    Spouse name: Not on file  . Number of children: 3  . Years of education: Not on file  . Highest education level: Not on file  Occupational History  . Occupation: retired    Fish farm manager: RETIRED  Social Needs  . Financial resource strain: Not on file  . Food insecurity:  Worry: Not on file    Inability: Not on file  . Transportation needs:    Medical: Not on file    Non-medical: Not on file  Tobacco Use  . Smoking status: Never Smoker  . Smokeless tobacco: Never Used  Substance and Sexual Activity  . Alcohol use: Yes    Alcohol/week: 14.0 standard drinks    Types: 14 Glasses of wine per week    Comment: at least two glasses of wine sometimes more nightly  . Drug use: No  . Sexual activity: Not Currently    Partners: Male    Birth control/protection: Post-menopausal  Lifestyle  . Physical activity:    Days per week: Not on file    Minutes per session: Not on file  . Stress: Not on file  Relationships  . Social  connections:    Talks on phone: Not on file    Gets together: Not on file    Attends religious service: Not on file    Active member of club or organization: Not on file    Attends meetings of clubs or organizations: Not on file    Relationship status: Not on file  Other Topics Concern  . Not on file  Social History Narrative  . Not on file     Family History:  The patient's family history includes Heart attack in her father and mother; Heart disease in her father and mother; Hypertension in her brother, father, maternal grandmother, and mother; Prostate cancer in her brother.   ROS:   Please see the history of present illness.    ROS All other systems reviewed and are negative.   PHYSICAL EXAM:   VS:  BP (!) 142/86   Pulse 79   Ht 5\' 3"  (1.6 m)   Wt 176 lb 3.2 oz (79.9 kg)   BMI 31.21 kg/m    GEN: Well nourished, well developed, in no acute distress  HEENT: normal  Neck: no JVD, carotid bruits, or masses Cardiac: RRR; no murmurs, rubs, or gallops,no edema  Respiratory:  clear to auscultation bilaterally, normal work of breathing GI: soft, nontender, nondistended, + BS MS: no deformity or atrophy  Skin: warm and dry, tiny blister all over the body Neuro:  Alert and Oriented x 3, Strength and sensation are intact Psych: euthymic mood, full affect  Wt Readings from Last 3 Encounters:  06/01/18 176 lb 3.2 oz (79.9 kg)  05/05/18 174 lb (78.9 kg)  10/20/17 180 lb (81.6 kg)      Studies/Labs Reviewed:   EKG:  EKG is ordered today.  The ekg ordered today demonstrates SR with chronic LBBB  Recent Labs: 10/20/2017: ALT 48; BUN 14; Creatinine, Ser 0.56; Hemoglobin 15.1; Platelets 179.0; Potassium 4.1; Sodium 141   Lipid Panel    Component Value Date/Time   CHOL 186 10/20/2017 1339   TRIG 101.0 10/20/2017 1339   HDL 55.40 10/20/2017 1339   CHOLHDL 3 10/20/2017 1339   VLDL 20.2 10/20/2017 1339   LDLCALC 110 (H) 10/20/2017 1339    Additional studies/ records that were  reviewed today include:   2D Echo 12/23/16 Study Conclusions  - Left ventricle: The cavity size was normal. Wall thickness was increased in a pattern of severe LVH. There was severe concentric hypertrophy. Systolic function was normal. The estimated ejection fraction was in the range of 60% to 65%. Wall motion was normal; there were no regional wall motion abnormalities. Doppler parameters are consistent with abnormal left ventricular relaxation (grade 1 diastolic dysfunction). -  Aortic valve: Small AV gradient sclerosis no stenosis and no LVOT gradient recorded at current HR and filling pressues. Poorly visualized. Mean gradient (S): 11 mm Hg. Valve area (VTI): 1.78 cm^2. - Mitral valve: There was trivial regurgitation and possible chordal SAM - Left atrium: The atrium was mildly dilated. - Atrial septum: No defect or patent foramen ovale was identified. - Pulmonary arteries: Systolic pressure could not be accurately estimated. - Impressions: Small LVOT but no LVOT gradient.  Impressions:  - Small LVOT but no LVOT gradient.  ASSESSMENT & PLAN:    1. HTN - BP of 142/80 here on lisinopril 10mg  qd. Normal at home. Stop lisinopril as below and start cardizem CD 120mg  qd.   2. Aortic valve disease - Last echo 12/2016 showed small LVOT but no LVOT gradient. Asymptomatic. No murmur heart.   3. Hx of SVT/PVCs - intermittent palpitation. Start Cardizem. If no improvement, will get monitor.   4 Rash - No improvement on steroids. Lisinopril can cause less than 1% of photosensitivity type rash. Will stop. Start Cardizem to help blood pressure and palpitations. She has follow up with dermatologist in October. Encouraged to follow up with PCP if no resolution of rash given tick on body. ? Has discussion with PCP. Patient seems very anxious.   Medication Adjustments/Labs and Tests Ordered: Current medicines are reviewed at length with the patient today.  Concerns  regarding medicines are outlined above.  Medication changes, Labs and Tests ordered today are listed in the Patient Instructions below. Patient Instructions  Medication Instructions:  Your physician has recommended you make the following change in your medication:  1.  STOP the Lisinopril 2.  START Cardizem 120 mg taking 1 tablet daily  Labwork: None ordered  Testing/Procedures: None ordered  Follow-Up: Your physician recommends that you schedule a follow-up appointment in: 07/12/18 ARRIVE AT 1:25 TO SEE VIN Rayanne Padmanabhan, PA-C  Any Other Special Instructions Will Be Listed Below (If Applicable).     If you need a refill on your cardiac medications before your next appointment, please call your pharmacy.      Jarrett Soho, [place]  06/01/2018 3:15 PM    [facility] [address], [place], [place]  [address] Phone: [phone]; Fax: [phone]

## 2018-06-01 NOTE — Patient Instructions (Addendum)
Medication Instructions:  Your physician has recommended you make the following change in your medication:  1.  STOP the Lisinopril 2.  START Cardizem 120 mg taking 1 tablet daily  Labwork: None ordered  Testing/Procedures: None ordered  Follow-Up: Your physician recommends that you schedule a follow-up appointment in: 07/12/18 ARRIVE AT 1:25 TO SEE VIN BHAGAT, PA-C  Any Other Special Instructions Will Be Listed Below (If Applicable).     If you need a refill on your cardiac medications before your next appointment, please call your pharmacy.

## 2018-06-02 ENCOUNTER — Telehealth: Payer: Self-pay

## 2018-06-02 NOTE — Telephone Encounter (Signed)
Letter received via fax from [facility] stating that they have approved the pts non formulary exception request for Cardizem CD 120 mg or CP24.  Approval good until 06/03/2019.  I have notified the pts pharmacy.

## 2018-06-02 NOTE — Telephone Encounter (Addendum)
**Note De-Identified Vivia [place] Obfuscation** I have done a Cardizem PA through covermymeds. ZFP:[id number]

## 2018-06-05 ENCOUNTER — Telehealth: Payer: Self-pay | Admitting: Cardiovascular Disease

## 2018-06-05 MED ORDER — DILTIAZEM HCL ER COATED BEADS 120 MG PO CP24: 120.0000 mg | ORAL_CAPSULE | Freq: Every day | ORAL | 3 refills | Status: DC

## 2018-06-05 NOTE — Telephone Encounter (Signed)
Pt's medication was sent to pt's pharmacy as requested. Confirmation received.  °

## 2018-06-05 NOTE — Telephone Encounter (Signed)
New Message:   The medication is on back order and not sure when it will be available,  Pt is requesting the generic brand of the medication     1. Which medications need to be refilled? (please list name of each medication and dose if known) diltiazem (CARDIZEM CD) 120 MG 24 hr capsule  2. Which pharmacy/location (including street and city if local pharmacy) is medication to be sent to?      [facility] ([place]), [place] - [address]    3. Do they need a 30 day or 90 day supply?  90 day supply

## 2018-06-29 ENCOUNTER — Encounter: Payer: Self-pay | Admitting: Physician Assistant

## 2018-07-12 ENCOUNTER — Ambulatory Visit: Payer: Medicare Other | Admitting: Physician Assistant

## 2018-07-12 ENCOUNTER — Encounter: Payer: Self-pay | Admitting: Physician Assistant

## 2018-07-12 VITALS — BP 126/84 | HR 80 | Ht 63.0 in | Wt 172.8 lb

## 2018-07-12 DIAGNOSIS — I493 Ventricular premature depolarization: Secondary | ICD-10-CM | POA: Diagnosis not present

## 2018-07-12 DIAGNOSIS — I10 Essential (primary) hypertension: Secondary | ICD-10-CM | POA: Diagnosis not present

## 2018-07-12 DIAGNOSIS — I359 Nonrheumatic aortic valve disorder, unspecified: Secondary | ICD-10-CM | POA: Diagnosis not present

## 2018-07-12 DIAGNOSIS — I471 Supraventricular tachycardia: Secondary | ICD-10-CM | POA: Diagnosis not present

## 2018-07-12 DIAGNOSIS — R002 Palpitations: Secondary | ICD-10-CM

## 2018-07-12 MED ORDER — LISINOPRIL 2.5 MG PO TABS: 2.5000 mg | ORAL_TABLET | Freq: Every day | ORAL | 0 refills | Status: DC

## 2018-07-12 MED ORDER — DILTIAZEM HCL ER COATED BEADS 120 MG PO CP24: 120.0000 mg | ORAL_CAPSULE | Freq: Every day | ORAL | 3 refills | Status: DC

## 2018-07-12 MED ORDER — LISINOPRIL 2.5 MG PO TABS: ORAL_TABLET | ORAL | 0 refills | Status: DC

## 2018-07-12 NOTE — Patient Instructions (Addendum)
Medication Instructions:  Your physician has recommended you make the following change in your medication:   1. Begin Lisinopril, 2.5mg , one tablet, as needed for high blood pressure greater than 150/95  Labwork: None ordered.  Testing/Procedures: None ordered.  Follow-Up: Your physician recommends that you schedule a follow-up appointment in:   4 months with Dr Angelena Form  Any Other Special Instructions Will Be Listed Below (If Applicable).     If you need a refill on your cardiac medications before your next appointment, please call your pharmacy.

## 2018-07-12 NOTE — Progress Notes (Signed)
Cardiology Office Note    Date:  07/12/2018   ID:  Madison Gutierrez, DOB May 10, 1946, MRN [id number]  PCP:  Hoyt Koch, MD  Cardiologist:  Dr. Angelena Form  Chief Complaint: 4 weeks follow up  History of Present Illness:   Madison Gutierrez is a 72 y.o. female with hx of Aortic valve disease, HTN, chronic LBBB and  SVT presents for follow up.   She firstestablishedcare in our practice in 2012 after her PCP referred her for evaluation for PVCs. She was ordered to wear a 48-hour Holter monitor. This showed normal sinus rhythm at baseline with frequent PVCs and several runsof SVT (longest104 beats at 177 beats per minute).Herechocardiogram showed normal left ventricular size and function with an EF of 70%. She was started on Cardizem CD, 120 mg daily, however she self discontinued this due to intolerances.She also has a history of hypertension which has been treated with lisinopril. In addition, she has mild aortic valve disease.  2D echo on 12/23/16 showed small AV gradient sclerosis,no stenosis and no LVOTgradient. EF normal at 60-65%.  She was treated for rash and tick with out significant improvement prior to evaluation by me 06/01/18. Stopped Lisinopril for photosensitivity type rash. Started cardizem for BP and palpitations.   Here today for follow up.  Since discontinuation of lisinopril her itching has been however progress remained.  She has follow-up with dermatologist next week.  Her palpitation has been resolved completely.  Denies shortness of breath, orthopnea, PND, syncope, lower extremity edema or melena.  Intermittent fatigue.  She had one episode of upper sternal heaviness while walking fast to her car at night in a strange environment.  No recurrent episode.   Past Medical History:  Diagnosis Date  . Asthma    mild asthma from dairy allergies  . Elevated LFTs 05/19/11  . GERD (gastroesophageal reflux disease)   . Heart palpitations   . Hepatic steatosis 05/24/11  .  Hepatitis, unspecified   . Hypertension   . Obesity   . Osteopenia 05/2011   t score -1.9  . Psoriasis   . Rheumatoid arthritis(714.0)   . Staph aureus infection 2007   rt. shin  . SVT (supraventricular tachycardia) ([place])   . Ulcer     Past Surgical History:  Procedure Laterality Date  . APPENDECTOMY  1998  . FACIAL RECONSTRUCTION SURGERY    . HERNIA REPAIR  2003   bi-lat  repair   . OVARIAN CYST REMOVAL  1978   from left ovary   . tummy tuck  2003    Current Medications: Prior to Admission medications   Medication Sig Start Date End Date Taking? Authorizing Provider  diltiazem (CARDIZEM CD) 120 MG 24 hr capsule Take 1 capsule (120 mg total) by mouth daily. 06/05/18   Burnell Blanks, MD    Allergies:   Dairy aid [lactase]; Petrolatum-zinc oxide; Sulfa antibiotics; and Sulfamethoxazole   Social History   Socioeconomic History  . Marital status: Married    Spouse name: Not on file  . Number of children: 3  . Years of education: Not on file  . Highest education level: Not on file  Occupational History  . Occupation: retired    Fish farm manager: RETIRED  Social Needs  . Financial resource strain: Not on file  . Food insecurity:    Worry: Not on file    Inability: Not on file  . Transportation needs:    Medical: Not on file    Non-medical: Not on file  Tobacco Use  . Smoking status: Never Smoker  . Smokeless tobacco: Never Used  Substance and Sexual Activity  . Alcohol use: Yes    Alcohol/week: 14.0 standard drinks    Types: 14 Glasses of wine per week    Comment: at least two glasses of wine sometimes more nightly  . Drug use: No  . Sexual activity: Not Currently    Partners: Male    Birth control/protection: Post-menopausal  Lifestyle  . Physical activity:    Days per week: Not on file    Minutes per session: Not on file  . Stress: Not on file  Relationships  . Social connections:    Talks on phone: Not on file    Gets together: Not on file     Attends religious service: Not on file    Active member of club or organization: Not on file    Attends meetings of clubs or organizations: Not on file    Relationship status: Not on file  Other Topics Concern  . Not on file  Social History Narrative  . Not on file     Family History:  The patient's family history includes Heart attack in her father and mother; Heart disease in her father and mother; Hypertension in her brother, father, maternal grandmother, and mother; Prostate cancer in her brother.   ROS:   Please see the history of present illness.    ROS All other systems reviewed and are negative.   PHYSICAL EXAM:   VS:  BP 126/84   Pulse 80   Ht 5\' 3"  (1.6 m)   Wt 172 lb 12.8 oz (78.4 kg)   SpO2 95%   BMI 30.61 kg/m    GEN: Well nourished, well developed, in no acute distress  HEENT: normal  Neck: no JVD, carotid bruits, or masses Cardiac: RRR; no murmurs, rubs, or gallops,no edema  Respiratory:  clear to auscultation bilaterally, normal work of breathing GI: soft, nontender, nondistended, + BS MS: no deformity or atrophy  Skin:diffuse rash Neuro:  Alert and Oriented x 3, Strength and sensation are intact Psych: euthymic mood, full affect  Wt Readings from Last 3 Encounters:  07/12/18 172 lb 12.8 oz (78.4 kg)  06/01/18 176 lb 3.2 oz (79.9 kg)  05/05/18 174 lb (78.9 kg)      Studies/Labs Reviewed:   EKG:  EKG is not ordered today.  Recent Labs: 10/20/2017: ALT 48; BUN 14; Creatinine, Ser 0.56; Hemoglobin 15.1; Platelets 179.0; Potassium 4.1; Sodium 141   Lipid Panel    Component Value Date/Time   CHOL 186 10/20/2017 1339   TRIG 101.0 10/20/2017 1339   HDL 55.40 10/20/2017 1339   CHOLHDL 3 10/20/2017 1339   VLDL 20.2 10/20/2017 1339   LDLCALC 110 (H) 10/20/2017 1339    Additional studies/ records that were reviewed today include:  2D Echo 12/23/16 Study Conclusions  - Left ventricle: The cavity size was normal. Wall thickness was increased in a  pattern of severe LVH. There was severe concentric hypertrophy. Systolic function was normal. The estimated ejection fraction was in the range of 60% to 65%. Wall motion was normal; there were no regional wall motion abnormalities. Doppler parameters are consistent with abnormal left ventricular relaxation (grade 1 diastolic dysfunction). - Aortic valve: Small AV gradient sclerosis no stenosis and no LVOT gradient recorded at current HR and filling pressues. Poorly visualized. Mean gradient (S): 11 mm Hg. Valve area (VTI): 1.78 cm^2. - Mitral valve: There was trivial regurgitation and possible chordal  SAM - Left atrium: The atrium was mildly dilated. - Atrial septum: No defect or patent foramen ovale was identified. - Pulmonary arteries: Systolic pressure could not be accurately estimated. - Impressions: Small LVOT but no LVOT gradient.  Impressions:  - Small LVOT but no LVOT gradient.  ASSESSMENT & PLAN:    1. Palpitation This has been resolved in addition of Cardizem.  However, patient has intermittent fatigue.  Cannot rule out arrhythmia as etiology.  Discussed monitor again however patient declined.  One episode of chest discomfort feels like panic attack.  She is not interested in any ischemic evaluation or repeat echocardiogram as well.  She will work on exercise routine until next office visit, if no improvement will do evaluation. She knows when to call.   2. HTN -Intermittent elevated blood pressures since last visit.  Mostly at night usually occur once every week.  Continue Cardizem.  She will take lisinopril as needed (seems not causing rash - she has follow up with dermatologist next week).   3. Aortic valve disorder - no dyspnea. Wants wait for repeat echo.     Medication Adjustments/Labs and Tests Ordered: Current medicines are reviewed at length with the patient today.  Concerns regarding medicines are outlined above.  Medication changes, Labs  and Tests ordered today are listed in the Patient Instructions below. Patient Instructions  Medication Instructions:  Your physician recommends that you continue on your current medications as directed. Please refer to the Current Medication list given to you today.  Labwork: None ordered.  Testing/Procedures: None ordered.  Follow-Up: Your physician recommends that you schedule a follow-up appointment in:   4 months with Dr Angelena Form  Any Other Special Instructions Will Be Listed Below (If Applicable).     If you need a refill on your cardiac medications before your next appointment, please call your pharmacy.     Jarrett Soho, [place]  07/12/2018 1:52 PM    [facility] [place], [place], [place]  [address] Phone: [phone]; Fax: [phone]

## 2018-07-17 DIAGNOSIS — L814 Other melanin hyperpigmentation: Secondary | ICD-10-CM | POA: Diagnosis not present

## 2018-07-17 DIAGNOSIS — D225 Melanocytic nevi of trunk: Secondary | ICD-10-CM | POA: Diagnosis not present

## 2018-07-17 DIAGNOSIS — D1801 Hemangioma of skin and subcutaneous tissue: Secondary | ICD-10-CM | POA: Diagnosis not present

## 2018-07-17 DIAGNOSIS — L821 Other seborrheic keratosis: Secondary | ICD-10-CM | POA: Diagnosis not present

## 2018-10-18 ENCOUNTER — Other Ambulatory Visit: Payer: Self-pay | Admitting: Physician Assistant

## 2018-11-01 DIAGNOSIS — H35372 Puckering of macula, left eye: Secondary | ICD-10-CM | POA: Diagnosis not present

## 2018-11-01 DIAGNOSIS — H4312 Vitreous hemorrhage, left eye: Secondary | ICD-10-CM | POA: Diagnosis not present

## 2018-11-01 DIAGNOSIS — H43813 Vitreous degeneration, bilateral: Secondary | ICD-10-CM | POA: Diagnosis not present

## 2018-11-01 DIAGNOSIS — H43391 Other vitreous opacities, right eye: Secondary | ICD-10-CM | POA: Diagnosis not present

## 2018-11-13 ENCOUNTER — Encounter: Payer: Self-pay | Admitting: *Deleted

## 2018-11-13 ENCOUNTER — Encounter: Payer: Self-pay | Admitting: Cardiovascular Disease

## 2018-11-13 ENCOUNTER — Ambulatory Visit: Payer: Medicare Other | Admitting: Cardiovascular Disease

## 2018-11-13 VITALS — BP 124/80 | HR 82 | Ht 63.0 in | Wt 180.0 lb

## 2018-11-13 DIAGNOSIS — I471 Supraventricular tachycardia: Secondary | ICD-10-CM | POA: Diagnosis not present

## 2018-11-13 DIAGNOSIS — I350 Nonrheumatic aortic (valve) stenosis: Secondary | ICD-10-CM | POA: Diagnosis not present

## 2018-11-13 DIAGNOSIS — R072 Precordial pain: Secondary | ICD-10-CM | POA: Diagnosis not present

## 2018-11-13 DIAGNOSIS — I10 Essential (primary) hypertension: Secondary | ICD-10-CM

## 2018-11-13 NOTE — Patient Instructions (Addendum)
Medication Instructions:  Your physician recommends that you continue on your current medications as directed. Please refer to the Current Medication list given to you today.  If you need a refill on your cardiac medications before your next appointment, please call your pharmacy.   Lab work: none If you have labs (blood work) drawn today and your tests are completely normal, you will receive your results only by: Marland Kitchen MyChart Message (if you have MyChart) OR . A paper copy in the mail If you have any lab test that is abnormal or we need to change your treatment, we will call you to review the results.  Testing/Procedures: Your physician has requested that you have an echocardiogram. Echocardiography is a painless test that uses sound waves to create images of your heart. It provides your doctor with information about the size and shape of your heart and how well your heart's chambers and valves are working. This procedure takes approximately one hour. There are no restrictions for this procedure.  Your physician has requested that you have a lexiscan myoview. For further information please visit [email or web address]. Please follow instruction sheet, as given.    Follow-Up: Your physician recommends that you schedule a follow-up appointment in: about 4-6 weeks.  Scheduled for April 1,2020 at 10:40

## 2018-11-13 NOTE — Progress Notes (Signed)
Chief Complaint  Patient presents with  . Follow-up    Aortic stenosis    History of Present Illness: 74 yo female with history of HTN, aortic stenosis, chronic LBBB and SVT here today for cardiac follow up. I saw her as a new patient August 2012 for further evaluation of palpitations. She was seen by Dr. Modena Morrow on 05/19/11 and EKG with frequent PVCs. I arranged an echo and a 48 hour Holter monitor August 2012. Her echo showed normal LV size and function with EF of 70%. There was mild aortic valve disease with mild stenosis and regurgitation. Her Holter showed normal sinus rhythm at baseline with frequent PVCs and several runs of SVT (longest 104 beats at 177 beats per minute). She was started on Cardizem. Echo March 2018 with LVEF=60-65%, aortic valve sclerosis with mild aortic stenosis.    He is here today for follow up. The patient denies any palpitations, lower extremity edema, orthopnea, PND, dizziness, near syncope or syncope. She describes heaviness in her chest with exertion. There is associated dyspnea. This happens most days.   Primary Care Physician:  Hoyt Koch, MD  Past Medical History:  Diagnosis Date  . Asthma    mild asthma from dairy allergies  . Elevated LFTs 05/19/11  . GERD (gastroesophageal reflux disease)   . Heart palpitations   . Hepatic steatosis 05/24/11  . Hepatitis, unspecified   . Hypertension   . Obesity   . Osteopenia 05/2011   t score -1.9  . Psoriasis   . Rheumatoid arthritis(714.0)   . Staph aureus infection 2007   rt. shin  . SVT (supraventricular tachycardia) ([place])   . Ulcer     Past Surgical History:  Procedure Laterality Date  . APPENDECTOMY  1998  . FACIAL RECONSTRUCTION SURGERY    . HERNIA REPAIR  2003   bi-lat  repair   . OVARIAN CYST REMOVAL  1978   from left ovary   . tummy tuck  2003    Current Outpatient Medications  Medication Sig Dispense Refill  . diltiazem (CARDIZEM CD) 120 MG 24 hr capsule Take 1 capsule (120  mg total) by mouth daily. 90 capsule 3  . lisinopril (PRINIVIL,ZESTRIL) 2.5 MG tablet TAKE 1 TABLET BY MOUTH ONCE DAILY 90 tablet 2   No current facility-administered medications for this visit.     Allergies  Allergen Reactions  . Dairy Aid [Lactase]   . Petrolatum-Zinc Oxide Rash  . Sulfa Antibiotics Rash  . Sulfamethoxazole Rash    Social History   Socioeconomic History  . Marital status: Married    Spouse name: Not on file  . Number of children: 3  . Years of education: Not on file  . Highest education level: Not on file  Occupational History  . Occupation: retired    Fish farm manager: RETIRED  Social Needs  . Financial resource strain: Not on file  . Food insecurity:    Worry: Not on file    Inability: Not on file  . Transportation needs:    Medical: Not on file    Non-medical: Not on file  Tobacco Use  . Smoking status: Never Smoker  . Smokeless tobacco: Never Used  Substance and Sexual Activity  . Alcohol use: Yes    Alcohol/week: 14.0 standard drinks    Types: 14 Glasses of wine per week    Comment: at least two glasses of wine sometimes more nightly  . Drug use: No  . Sexual activity: Not Currently  Partners: Male    Birth control/protection: Post-menopausal  Lifestyle  . Physical activity:    Days per week: Not on file    Minutes per session: Not on file  . Stress: Not on file  Relationships  . Social connections:    Talks on phone: Not on file    Gets together: Not on file    Attends religious service: Not on file    Active member of club or organization: Not on file    Attends meetings of clubs or organizations: Not on file    Relationship status: Not on file  . Intimate partner violence:    Fear of current or ex partner: Not on file    Emotionally abused: Not on file    Physically abused: Not on file    Forced sexual activity: Not on file  Other Topics Concern  . Not on file  Social History Narrative  . Not on file    Family History    Problem Relation Age of Onset  . Hypertension Father   . Heart disease Father   . Heart attack Father   . Hypertension Mother   . Heart disease Mother   . Heart attack Mother   . Hypertension Brother   . Prostate cancer Brother   . Hypertension Maternal Grandmother   . Colon cancer Neg Hx     Review of Systems:  As stated in the HPI and otherwise negative.   BP 124/80   Pulse 82   Ht 5\' 3"  (1.6 m)   Wt 180 lb (81.6 kg)   SpO2 96%   BMI 31.89 kg/m   Physical Examination: General: Well developed, well nourished, NAD  HEENT: OP clear, mucus membranes moist  SKIN: warm, dry. No rashes. Neuro: No focal deficits  Musculoskeletal: Muscle strength 5/5 all ext  Psychiatric: Mood and affect normal  Neck: No JVD, no carotid bruits, no thyromegaly, no lymphadenopathy.  Lungs:Clear bilaterally, no wheezes, rhonci, crackles Cardiovascular: Regular rate and rhythm. Soft systolic murmur.  Abdomen:Soft. Bowel sounds present. Non-tender.  Extremities: No lower extremity edema. Pulses are 2 + in the bilateral DP/PT.  Echo March 2018: - Left ventricle: The cavity size was normal. Wall thickness was   increased in a pattern of severe LVH. There was severe concentric   hypertrophy. Systolic function was normal. The estimated ejection   fraction was in the range of 60% to 65%. Wall motion was normal;   there were no regional wall motion abnormalities. Doppler   parameters are consistent with abnormal left ventricular   relaxation (grade 1 diastolic dysfunction). - Aortic valve: Small AV gradient sclerosis no stenosis and no LVOT   gradient recorded at current HR and filling pressues. Poorly   visualized. Mean gradient (S): 11 mm Hg. Valve area (VTI): 1.78   cm^2. - Mitral valve: There was trivial regurgitation and possible   chordal SAM - Left atrium: The atrium was mildly dilated. - Atrial septum: No defect or patent foramen ovale was identified. - Pulmonary arteries: Systolic  pressure could not be accurately   estimated. - Impressions: Small LVOT but no LVOT gradient.  Impressions:  - Small LVOT but no LVOT gradient.   EKG:  EKG is not ordered today. The ekg ordered today demonstrates   Recent Labs: No results found for requested labs within last 8760 hours.   Lipid Panel    Component Value Date/Time   CHOL 186 10/20/2017 1339   TRIG 101.0 10/20/2017 1339   HDL 55.40  10/20/2017 1339   CHOLHDL 3 10/20/2017 1339   VLDL 20.2 10/20/2017 1339   LDLCALC 110 (H) 10/20/2017 1339     Wt Readings from Last 3 Encounters:  11/13/18 180 lb (81.6 kg)  07/12/18 172 lb 12.8 oz (78.4 kg)  06/01/18 176 lb 3.2 oz (79.9 kg)     Other studies Reviewed: Additional studies/ records that were reviewed today include: . Review of the above records demonstrates:    Assessment and Plan:   1. SVT: No palpitations. Continue Cardizem.   2. HTN: BP is well controlled.  No changes  3. Aortic valve stenosis: Mild by echo in March 2018. Will repeat echo now.   4. LBBB: Chronic.    5. Chest pain: Mild chest pain daily with exertion. Will arrange Lexiscan nuclear stress test to exclude ischemia.   Current medicines are reviewed at length with the patient today.  The patient does not have concerns regarding medicines.  The following changes have been made:  no change  Labs/ tests ordered today include:   Orders Placed This Encounter  Procedures  . MYOCARDIAL PERFUSION IMAGING  . ECHOCARDIOGRAM COMPLETE    Disposition:   FU with me in 12  months  Signed, Lauree Chandler, MD 11/13/2018 4:42 [facility] [place], [place], [place]  [address] Phone: [phone]; Fax: [phone]

## 2018-11-21 ENCOUNTER — Telehealth (HOSPITAL_COMMUNITY): Payer: Self-pay | Admitting: *Deleted

## 2018-11-21 NOTE — Telephone Encounter (Signed)
Patient given detailed instructions per Myocardial Perfusion Study Information Sheet for the test on 11/24/18 at 1015. Patient notified to arrive 15 minutes early and that it is imperative to arrive on time for appointment to keep from having the test rescheduled.  If you need to cancel or reschedule your appointment, please call the office within 24 hours of your appointment. . Patient verbalized understanding.Madison Gutierrez, [name] Patient has instruction letter at home  \

## 2018-11-24 ENCOUNTER — Ambulatory Visit (HOSPITAL_COMMUNITY): Payer: Medicare Other | Attending: Cardiovascular Disease

## 2018-11-24 ENCOUNTER — Ambulatory Visit (HOSPITAL_BASED_OUTPATIENT_CLINIC_OR_DEPARTMENT_OTHER): Payer: Medicare Other

## 2018-11-24 DIAGNOSIS — R072 Precordial pain: Secondary | ICD-10-CM

## 2018-11-24 DIAGNOSIS — I350 Nonrheumatic aortic (valve) stenosis: Secondary | ICD-10-CM | POA: Diagnosis not present

## 2018-11-24 LAB — MYOCARDIAL PERFUSION IMAGING
CHL CUP NUCLEAR SRS: 4
CHL CUP NUCLEAR SSS: 5
LV dias vol: 115 mL (ref 46–106)
LVSYSVOL: 79 mL
NUC STRESS TID: 1.09
Peak HR: 117 {beats}/min
Rest HR: 97 {beats}/min
SDS: 1

## 2018-11-24 MED ORDER — REGADENOSON 0.4 MG/5ML IV SOLN
0.4000 mg | Freq: Once | INTRAVENOUS | Status: AC
  Administered 2018-11-24: 0.4 mg via INTRAVENOUS

## 2018-11-24 MED ORDER — PERFLUTREN LIPID MICROSPHERE
1.0000 mL | INTRAVENOUS | Status: AC | PRN
  Administered 2018-11-24: 2 mL via INTRAVENOUS

## 2018-11-24 MED ORDER — TECHNETIUM TC 99M TETROFOSMIN IV KIT
32.4000 | PACK | Freq: Once | INTRAVENOUS | Status: AC | PRN
  Administered 2018-11-24: 32.4 via INTRAVENOUS
  Filled 2018-11-24: qty 33

## 2018-11-24 MED ORDER — TECHNETIUM TC 99M TETROFOSMIN IV KIT
10.4000 | PACK | Freq: Once | INTRAVENOUS | Status: AC | PRN
  Administered 2018-11-24: 10.4 via INTRAVENOUS
  Filled 2018-11-24: qty 11

## 2018-11-28 ENCOUNTER — Telehealth: Payer: Self-pay | Admitting: Cardiovascular Disease

## 2018-11-28 NOTE — Telephone Encounter (Signed)
Did not need this appt

## 2018-12-04 ENCOUNTER — Encounter: Payer: Self-pay | Admitting: Cardiovascular Disease

## 2018-12-04 ENCOUNTER — Ambulatory Visit: Payer: Medicare Other | Admitting: Cardiovascular Disease

## 2018-12-04 VITALS — BP 150/90 | HR 90 | Ht 63.0 in | Wt 185.4 lb

## 2018-12-04 DIAGNOSIS — I10 Essential (primary) hypertension: Secondary | ICD-10-CM | POA: Diagnosis not present

## 2018-12-04 DIAGNOSIS — I420 Dilated cardiomyopathy: Secondary | ICD-10-CM

## 2018-12-04 DIAGNOSIS — I350 Nonrheumatic aortic (valve) stenosis: Secondary | ICD-10-CM

## 2018-12-04 DIAGNOSIS — I447 Left bundle-branch block, unspecified: Secondary | ICD-10-CM

## 2018-12-04 DIAGNOSIS — I471 Supraventricular tachycardia: Secondary | ICD-10-CM

## 2018-12-04 MED ORDER — ASPIRIN EC 81 MG PO TBEC: 81.0000 mg | DELAYED_RELEASE_TABLET | Freq: Every day | ORAL | 3 refills | Status: DC

## 2018-12-04 MED ORDER — METOPROLOL SUCCINATE ER 25 MG PO TB24: 25.0000 mg | ORAL_TABLET | Freq: Every day | ORAL | 2 refills | Status: DC

## 2018-12-04 NOTE — H&P (View-Only) (Signed)
Chief Complaint  Patient presents with   Follow-up    Cardiomyopathy    History of Present Illness: 73 yo female with history of HTN, aortic stenosis, chronic LBBB and SVT here today for cardiac follow up. I saw her as a new patient August 2012 for evaluation of palpitations. She was seen by Dr. Modena Morrow on 05/19/11 and EKG with frequent PVCs. I arranged an echo and a 48 hour Holter monitor August 2012. Her echo showed normal LV size and function with EF of 70%. There was mild aortic valve disease with mild stenosis and regurgitation. Her Holter showed normal sinus rhythm at baseline with frequent PVCs and several runs of SVT (longest 104 beats at 177 beats per minute). She was started on Cardizem. Echo March 2018 with LVEF=60-65%, aortic valve sclerosis with mild aortic stenosis.  I saw her in the office 11/13/18 and she c/o chest pressure and dyspnea with exertion. Echo 11/24/18 with LVEF=35-40% with severe hypokinesis of the inferoseptal and anteroseptal walls. The aortic leaflets are thickened with restricted opening mean gradient of 14 mmHg c/w mild aortic stenosis. Mild MR. Nuclear stress test 11/24/18 without any large areas of ischemia.    She is here today for follow up. The patient denies any palpitations, lower extremity edema, orthopnea, PND, dizziness, near syncope or syncope. She has mild chest pressure with exertion. She has dyspnea with exertion. No LE edema.   Primary Care Physician:  Hoyt Koch, MD  Past Medical History:  Diagnosis Date   Asthma    mild asthma from dairy allergies   Elevated LFTs 05/19/11   GERD (gastroesophageal reflux disease)    Heart palpitations    Hepatic steatosis 05/24/11   Hepatitis, unspecified    Hypertension    Obesity    Osteopenia 05/2011   t score -1.9   Psoriasis    Rheumatoid arthritis(714.0)    Staph aureus infection 2007   rt. shin   SVT (supraventricular tachycardia) ([place])    Ulcer     Past Surgical History:    Procedure Laterality Date   [place]  2003   bi-lat  repair    OVARIAN CYST REMOVAL  1978   from left ovary    tummy tuck  2003    Current Outpatient Medications  Medication Sig Dispense Refill   diltiazem (CARDIZEM CD) 120 MG 24 hr capsule Take 1 capsule (120 mg total) by mouth daily. 90 capsule 3   lisinopril (PRINIVIL,ZESTRIL) 2.5 MG tablet TAKE 1 TABLET BY MOUTH ONCE DAILY 90 tablet 2   No current facility-administered medications for this visit.     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 on file   Food insecurity:    Worry: Not on file    Inability: Not on file   Transportation needs:    Medical: Not on file    Non-medical: Not on file  Tobacco Use   Smoking status: Never Smoker  Smokeless tobacco: Never Used  Substance and Sexual Activity   Alcohol use: Yes    Alcohol/week: 14.0 standard drinks    Types: 14 Glasses of wine per week    Comment: at least two glasses of wine sometimes more nightly   Drug use: No   Sexual activity: Not Currently    Partners: Male    Birth control/protection: Post-menopausal  Lifestyle   Physical activity:    Days per week: Not on file    Minutes per session: Not on file   Stress: Not on file  Relationships   Social connections:    Talks on phone: Not on file    Gets together: Not on file    Attends religious service: Not on file    Active member of club or organization: Not on file    Attends meetings of clubs or organizations: Not on  file    Relationship status: Not on file   Intimate partner violence:    Fear of current or ex partner: Not on file    Emotionally abused: Not on file    Physically abused: Not on file    Forced sexual activity: Not on file  Other Topics Concern   Not on file  Social History Narrative   Not on file    Family History  Problem Relation Age of Onset   Hypertension Father    Heart disease Father    Heart attack Father    Hypertension Mother    Heart disease Mother    Heart attack Mother    Hypertension Brother    Prostate cancer Brother    Hypertension Maternal Grandmother    Colon cancer Neg Hx     Review of Systems:  As stated in the HPI and otherwise negative.   BP (!) 150/90    Pulse 90    Ht 5\' 3"  (1.6 m)    Wt 185 lb 6.4 oz (84.1 kg)    SpO2 97%    BMI 32.84 kg/m   Physical Examination: General: Well developed, well nourished, NAD  HEENT: OP clear, mucus membranes moist  SKIN: warm, dry. No rashes. Neuro: No focal deficits  Musculoskeletal: Muscle strength 5/5 all ext  Psychiatric: Mood and affect normal  Neck: No JVD, no carotid bruits, no thyromegaly, no lymphadenopathy.  Lungs:Clear bilaterally, no wheezes, rhonci, crackles Cardiovascular: Regular rate and rhythm. Harsh systolic murmur.  Abdomen:Soft. Bowel sounds present. Non-tender.  Extremities: No lower extremity edema. Pulses are 2 + in the bilateral DP/PT.  Echo 11/24/18:  1. The left ventricle has moderately reduced systolic function, with an ejection fraction of 35-40%. The cavity size was normal. There is severely increased left ventricular wall thickness. Left ventricular diastolic Doppler parameters are consistent  with impaired relaxation. Severe hypokinesis of the inferoseptal and anteroseptal walls. Inferior hypokinesis. Septal-lateral dyssynchrony due to LBBB.  2. The right ventricle has normal systolic function. The cavity was normal. There is no increase in right ventricular wall  thickness.  3. Left atrial size was mildly dilated.  4. Trivial pericardial effusion.  5. The mitral valve is normal in structure. There is mild mitral annular calcification present. No evidence of mitral valve stenosis. Mild mitral regurgitation.  6. The tricuspid valve is normal in structure.  7. The aortic valve is tricuspid Moderate calcification of the aortic valve. mild stenosis of the aortic valve. Mean gradient 14 mmHg.  8. The pulmonic valve was normal in structure.  9. Right atrial pressure is estimated at 3 mmHg.  10. No complete TR doppler jet so unable to estimate PA systolic pressure.  Nuclear stress test 11/24/18:  Nuclear stress EF: 31%.  Blood pressure demonstrated a normal response to exercise.  There was no ST segment deviation noted during stress.  Defect 1: There is a small defect of moderate severity present in the mid anterior, apical anterior and apex location.  Findings consistent with prior myocardial infarction.  This is a high risk study.  The left ventricular ejection fraction is moderately decreased (30-44%).   There is a small, moderate, fixed perfusion defect in the anterior wall at the mid ventricle and apex.  There is an associated wall motion abnormality, however the entire ventricle is hypokinetic.  This fixed perfusion defect may be due to artifact from breast attenuation.  Left ventricular ejection fraction calculates to 31%.  Underlying left bundle branch block.  EKG:  EKG is ordered today. The ekg ordered today demonstrates NSR, LBBB  Recent Labs: No results found for requested labs within last 8760 hours.   Lipid Panel    Component Value Date/Time   CHOL 186 10/20/2017 1339   TRIG 101.0 10/20/2017 1339   HDL 55.40 10/20/2017 1339   CHOLHDL 3 10/20/2017 1339   VLDL 20.2 10/20/2017 1339   LDLCALC 110 (H) 10/20/2017 1339     Wt Readings from Last 3 Encounters:  12/04/18 185 lb 6.4 oz (84.1 kg)  11/13/18 180 lb (81.6 kg)  07/12/18 172  lb 12.8 oz (78.4 kg)     Other studies Reviewed: Additional studies/ records that were reviewed today include: . Review of the above records demonstrates:    Assessment and Plan:   1. SVT: She has no palpitations. Will change Cardizem to Toprol 25 mg daily given new finding of LV systolic dysfunction.  2. HTN: BP is controlled at home.    3. Aortic valve stenosis: Mild by echo in February 2020 but LV function is depressed which may have falsely lowered her gradient. Will plan cardiac cath to assess further.    4. LBBB: Chronic.    5. Cardiomyopathy: New wall motion abnormality on echo with reduced LV systolic function. In setting of chest pain, will need cardiac cath. Will arrange a right and left heart catheterization at [facility] on 12/29/18.  I have reviewed the risks, indications, and alternatives to cardiac catheterization, possible angioplasty, and stenting with the patient. Risks include but are not limited to bleeding, infection, vascular injury, stroke, myocardial infection, arrhythmia, kidney injury, radiation-related injury in the case of prolonged fluoroscopy use, emergency cardiac surgery, and death. The patient understands the risks of serious complication is 1-2 in 5885 with diagnostic cardiac cath and 1-2% or less with angioplasty/stenting. Pre-cath labs today   Current medicines are reviewed at length with the patient today.  The patient does not have concerns regarding medicines.  The following changes have been made:  no change  Labs/ tests ordered today include:   No orders of the defined types were placed in this encounter.   Disposition:   FU with me in 12  months  Signed, Lauree Chandler, MD 12/04/2018 3:30 PM    [address], [place], [place]  [address] Phone: [phone]; Fax: [phone]

## 2018-12-04 NOTE — Progress Notes (Signed)
Chief Complaint  Patient presents with  . Follow-up    Cardiomyopathy    History of Present Illness: 73 yo female with history of HTN, aortic stenosis, chronic LBBB and SVT here today for cardiac follow up. I saw her as a new patient August 2012 for evaluation of palpitations. She was seen by Dr. Modena Morrow on 05/19/11 and EKG with frequent PVCs. I arranged an echo and a 48 hour Holter monitor August 2012. Her echo showed normal LV size and function with EF of 70%. There was mild aortic valve disease with mild stenosis and regurgitation. Her Holter showed normal sinus rhythm at baseline with frequent PVCs and several runs of SVT (longest 104 beats at 177 beats per minute). She was started on Cardizem. Echo March 2018 with LVEF=60-65%, aortic valve sclerosis with mild aortic stenosis.  I saw her in the office 11/13/18 and she c/o chest pressure and dyspnea with exertion. Echo 11/24/18 with LVEF=35-40% with severe hypokinesis of the inferoseptal and anteroseptal walls. The aortic leaflets are thickened with restricted opening mean gradient of 14 mmHg c/w mild aortic stenosis. Mild MR. Nuclear stress test 11/24/18 without any large areas of ischemia.    She is here today for follow up. The patient denies any palpitations, lower extremity edema, orthopnea, PND, dizziness, near syncope or syncope. She has mild chest pressure with exertion. She has dyspnea with exertion. No LE edema.   Primary Care Physician:  Hoyt Koch, MD  Past Medical History:  Diagnosis Date  . Asthma    mild asthma from dairy allergies  . Elevated LFTs 05/19/11  . GERD (gastroesophageal reflux disease)   . Heart palpitations   . Hepatic steatosis 05/24/11  . Hepatitis, unspecified   . Hypertension   . Obesity   . Osteopenia 05/2011   t score -1.9  . Psoriasis   . Rheumatoid arthritis(714.0)   . Staph aureus infection 2007   rt. shin  . SVT (supraventricular tachycardia) ([place])   . Ulcer     Past Surgical History:    Procedure Laterality Date  . APPENDECTOMY  1998  . FACIAL RECONSTRUCTION SURGERY    . HERNIA REPAIR  2003   bi-lat  repair   . OVARIAN CYST REMOVAL  1978   from left ovary   . tummy tuck  2003    Current Outpatient Medications  Medication Sig Dispense Refill  . diltiazem (CARDIZEM CD) 120 MG 24 hr capsule Take 1 capsule (120 mg total) by mouth daily. 90 capsule 3  . lisinopril (PRINIVIL,ZESTRIL) 2.5 MG tablet TAKE 1 TABLET BY MOUTH ONCE DAILY 90 tablet 2   No current facility-administered medications for this visit.     Allergies  Allergen Reactions  . Dairy Aid [Lactase]   . Petrolatum-Zinc Oxide Rash  . Sulfa Antibiotics Rash  . Sulfamethoxazole Rash    Social History   Socioeconomic History  . Marital status: Married    Spouse name: Not on file  . Number of children: 3  . Years of education: Not on file  . Highest education level: Not on file  Occupational History  . Occupation: retired    Fish farm manager: RETIRED  Social Needs  . Financial resource strain: Not on file  . Food insecurity:    Worry: Not on file    Inability: Not on file  . Transportation needs:    Medical: Not on file    Non-medical: Not on file  Tobacco Use  . Smoking status: Never Smoker  .  Smokeless tobacco: Never Used  Substance and Sexual Activity  . Alcohol use: Yes    Alcohol/week: 14.0 standard drinks    Types: 14 Glasses of wine per week    Comment: at least two glasses of wine sometimes more nightly  . Drug use: No  . Sexual activity: Not Currently    Partners: Male    Birth control/protection: Post-menopausal  Lifestyle  . Physical activity:    Days per week: Not on file    Minutes per session: Not on file  . Stress: Not on file  Relationships  . Social connections:    Talks on phone: Not on file    Gets together: Not on file    Attends religious service: Not on file    Active member of club or organization: Not on file    Attends meetings of clubs or organizations: Not on  file    Relationship status: Not on file  . Intimate partner violence:    Fear of current or ex partner: Not on file    Emotionally abused: Not on file    Physically abused: Not on file    Forced sexual activity: Not on file  Other Topics Concern  . Not on file  Social History Narrative  . Not on file    Family History  Problem Relation Age of Onset  . Hypertension Father   . Heart disease Father   . Heart attack Father   . Hypertension Mother   . Heart disease Mother   . Heart attack Mother   . Hypertension Brother   . Prostate cancer Brother   . Hypertension Maternal Grandmother   . Colon cancer Neg Hx     Review of Systems:  As stated in the HPI and otherwise negative.   BP (!) 150/90   Pulse 90   Ht 5\' 3"  (1.6 m)   Wt 185 lb 6.4 oz (84.1 kg)   SpO2 97%   BMI 32.84 kg/m   Physical Examination: General: Well developed, well nourished, NAD  HEENT: OP clear, mucus membranes moist  SKIN: warm, dry. No rashes. Neuro: No focal deficits  Musculoskeletal: Muscle strength 5/5 all ext  Psychiatric: Mood and affect normal  Neck: No JVD, no carotid bruits, no thyromegaly, no lymphadenopathy.  Lungs:Clear bilaterally, no wheezes, rhonci, crackles Cardiovascular: Regular rate and rhythm. Harsh systolic murmur.  Abdomen:Soft. Bowel sounds present. Non-tender.  Extremities: No lower extremity edema. Pulses are 2 + in the bilateral DP/PT.  Echo 11/24/18:  1. The left ventricle has moderately reduced systolic function, with an ejection fraction of 35-40%. The cavity size was normal. There is severely increased left ventricular wall thickness. Left ventricular diastolic Doppler parameters are consistent  with impaired relaxation. Severe hypokinesis of the inferoseptal and anteroseptal walls. Inferior hypokinesis. Septal-lateral dyssynchrony due to LBBB.  2. The right ventricle has normal systolic function. The cavity was normal. There is no increase in right ventricular wall  thickness.  3. Left atrial size was mildly dilated.  4. Trivial pericardial effusion.  5. The mitral valve is normal in structure. There is mild mitral annular calcification present. No evidence of mitral valve stenosis. Mild mitral regurgitation.  6. The tricuspid valve is normal in structure.  7. The aortic valve is tricuspid Moderate calcification of the aortic valve. mild stenosis of the aortic valve. Mean gradient 14 mmHg.  8. The pulmonic valve was normal in structure.  9. Right atrial pressure is estimated at 3 mmHg. 10. No complete TR doppler  jet so unable to estimate PA systolic pressure.  Nuclear stress test 11/24/18:  Nuclear stress EF: 31%.  Blood pressure demonstrated a normal response to exercise.  There was no ST segment deviation noted during stress.  Defect 1: There is a small defect of moderate severity present in the mid anterior, apical anterior and apex location.  Findings consistent with prior myocardial infarction.  This is a high risk study.  The left ventricular ejection fraction is moderately decreased (30-44%).   There is a small, moderate, fixed perfusion defect in the anterior wall at the mid ventricle and apex.  There is an associated wall motion abnormality, however the entire ventricle is hypokinetic.  This fixed perfusion defect may be due to artifact from breast attenuation.  Left ventricular ejection fraction calculates to 31%.  Underlying left bundle branch block.  EKG:  EKG is ordered today. The ekg ordered today demonstrates NSR, LBBB  Recent Labs: No results found for requested labs within last 8760 hours.   Lipid Panel    Component Value Date/Time   CHOL 186 10/20/2017 1339   TRIG 101.0 10/20/2017 1339   HDL 55.40 10/20/2017 1339   CHOLHDL 3 10/20/2017 1339   VLDL 20.2 10/20/2017 1339   LDLCALC 110 (H) 10/20/2017 1339     Wt Readings from Last 3 Encounters:  12/04/18 185 lb 6.4 oz (84.1 kg)  11/13/18 180 lb (81.6 kg)  07/12/18 172  lb 12.8 oz (78.4 kg)     Other studies Reviewed: Additional studies/ records that were reviewed today include: . Review of the above records demonstrates:    Assessment and Plan:   1. SVT: She has no palpitations. Will change Cardizem to Toprol 25 mg daily given new finding of LV systolic dysfunction.  2. HTN: BP is controlled at home.    3. Aortic valve stenosis: Mild by echo in February 2020 but LV function is depressed which may have falsely lowered her gradient. Will plan cardiac cath to assess further.    4. LBBB: Chronic.    5. Cardiomyopathy: New wall motion abnormality on echo with reduced LV systolic function. In setting of chest pain, will need cardiac cath. Will arrange a right and left heart catheterization at [facility] on 12/29/18.  I have reviewed the risks, indications, and alternatives to cardiac catheterization, possible angioplasty, and stenting with the patient. Risks include but are not limited to bleeding, infection, vascular injury, stroke, myocardial infection, arrhythmia, kidney injury, radiation-related injury in the case of prolonged fluoroscopy use, emergency cardiac surgery, and death. The patient understands the risks of serious complication is 1-2 in 3536 with diagnostic cardiac cath and 1-2% or less with angioplasty/stenting. Pre-cath labs today   Current medicines are reviewed at length with the patient today.  The patient does not have concerns regarding medicines.  The following changes have been made:  no change  Labs/ tests ordered today include:   No orders of the defined types were placed in this encounter.   Disposition:   FU with me in 12  months  Signed, Lauree Chandler, MD 12/04/2018 3:30 PM    [address], [place], [place]  [address] Phone: [phone]; Fax: [phone]

## 2018-12-04 NOTE — Patient Instructions (Addendum)
Medication Instructions:  Your physician has recommended you make the following change in your medication: Stop Cardizem. Start Toprol 25 mg by mouth daily.  Start aspirin 81 mg by mouth daily  If you need a refill on your cardiac medications before your next appointment, please call your pharmacy.   Lab work: Lab work to be done today--BMP and CBC If you have labs (blood work) drawn today and your tests are completely normal, you will receive your results only by: [facility] MyChart Message (if you have MyChart) OR . A paper copy in the mail If you have any lab test that is abnormal or we need to change your treatment, we will call you to review the results.  Testing/Procedures: Your physician has requested that you have a cardiac catheterization. Cardiac catheterization is used to diagnose and/or treat various heart conditions. Doctors may recommend this procedure for a number of different reasons. The most common reason is to evaluate chest pain. Chest pain can be a symptom of coronary artery disease (CAD), and cardiac catheterization can show whether plaque is narrowing or blocking your heart's arteries. This procedure is also used to evaluate the valves, as well as measure the blood flow and oxygen levels in different parts of your heart. For further information please visit [facility]. Please follow instruction sheet, as given.  Scheduled for March 20,2020  Follow-Up: Follow up with Dr. Angelena Form as planned on April 1,2020   Any Other Special Instructions Will Be Listed Below (If Applicable).    [facility] [place], [place] [address] Dept: [phone] Loc: [phone]  [facility]  12/04/2018  You are scheduled for a Cardiac Catheterization on Friday, March 20 with Dr. Lauree Chandler.  1. Please arrive at the [facility] (Main Entrance A) at [facility]:  [address] [place], [place] [address] at 8:30 AM (This time is two hours before your procedure to ensure your preparation). Free valet parking service is available.   Special note: Every effort is made to have your procedure done on time. Please understand that emergencies sometimes delay scheduled procedures.  2. Diet: Do not eat solid foods after midnight.  The patient may have clear liquids until 5am upon the day of the procedure.  3. Labs: Lab work was done in office on February 24,2020  4. Medication instructions in preparation for your procedure:   Contrast Allergy: No    On the morning of your procedure, take   Aspirin 81 mg and any morning medicines NOT listed above.  You may use sips of water.  5. Plan for one night stay--bring personal belongings. 6. Bring a current list of your medications and current insurance cards. 7. You MUST have a responsible person to drive you home. 8. Someone MUST be with you the first 24 hours after you arrive home or your discharge will be delayed. 9. Please wear clothes that are easy to get on and off and wear slip-on shoes.  Thank you for allowing [place] to care for you!   -- [facility]

## 2018-12-05 LAB — CBC
Hematocrit: 44.5 % (ref 34.0–46.6)
Hemoglobin: 15 g/dL (ref 11.1–15.9)
MCH: 30.4 pg (ref 26.6–33.0)
MCHC: 33.7 g/dL (ref 31.5–35.7)
MCV: 90 fL (ref 79–97)
PLATELETS: 167 10*3/uL (ref 150–450)
RBC: 4.94 x10E6/uL (ref 3.77–5.28)
RDW: 12.5 % (ref 11.7–15.4)
WBC: 6.8 10*3/uL (ref 3.4–10.8)

## 2018-12-05 LAB — BASIC METABOLIC PANEL
BUN/Creatinine Ratio: 20 (ref 12–28)
BUN: 14 mg/dL (ref 8–27)
CO2: 26 mmol/L (ref 20–29)
Calcium: 9.2 mg/dL (ref 8.7–10.3)
Chloride: 102 mmol/L (ref 96–106)
Creatinine, Ser: 0.69 mg/dL (ref 0.57–1.00)
GFR calc non Af Amer: 87 mL/min/{1.73_m2} (ref >59)
GFR, EST AFRICAN AMERICAN: 100 mL/min/{1.73_m2} (ref >59)
Glucose: 90 mg/dL (ref 65–99)
Potassium: 4.5 mmol/L (ref 3.5–5.2)
Sodium: 141 mmol/L (ref 134–144)

## 2018-12-26 ENCOUNTER — Telehealth: Payer: Self-pay | Admitting: *Deleted

## 2018-12-26 NOTE — Telephone Encounter (Signed)
Pt contacted pre-catheterization scheduled at [facility] for: Friday March 20,2020 10:30 AM Verified arrival time and place: [place] Entrance A at: 8:30 AM  No solid food after midnight prior to cath, clear liquids until 5 AM day of procedure. Contrast allergy: no  AM meds can be  taken pre-cath with sip of water including: ASA 81 mg  Confirmed patient has responsible person to drive home post procedure and observe 24 hours after arriving home: yes

## 2018-12-28 NOTE — Progress Notes (Signed)
  Coronavirus Screening  Have you experienced the following symptoms:  Cough yes/no: No Fever (>100.37F)  yes/no: No Runny nose yes/no: No Sore throat yes/no: No Difficulty breathing/shortness of breath  yes/no: No  Have you or a family member traveled in the last 14 days and where? yes/no: Yes   Please remind your patients and families that hospital visitation restrictions are in effect and the importance of the restrictions.

## 2018-12-29 ENCOUNTER — Encounter (HOSPITAL_COMMUNITY)
Admission: RE | Disposition: A | Discharge status: Home / Self Care | Payer: Self-pay | Source: Home / Self Care | Attending: Cardiovascular Disease

## 2018-12-29 ENCOUNTER — Ambulatory Visit (HOSPITAL_COMMUNITY)
Admission: RE | Admit: 2018-12-29 | Discharge: 2018-12-29 | Disposition: A | Discharge status: Home / Self Care | Payer: Medicare Other | Attending: Cardiovascular Disease | Admitting: Cardiovascular Disease

## 2018-12-29 ENCOUNTER — Other Ambulatory Visit: Payer: Self-pay

## 2018-12-29 DIAGNOSIS — I420 Dilated cardiomyopathy: Secondary | ICD-10-CM

## 2018-12-29 DIAGNOSIS — Z8249 Family history of ischemic heart disease and other diseases of the circulatory system: Secondary | ICD-10-CM | POA: Diagnosis not present

## 2018-12-29 DIAGNOSIS — I2510 Atherosclerotic heart disease of native coronary artery without angina pectoris: Secondary | ICD-10-CM

## 2018-12-29 DIAGNOSIS — K219 Gastro-esophageal reflux disease without esophagitis: Secondary | ICD-10-CM | POA: Diagnosis not present

## 2018-12-29 DIAGNOSIS — Z882 Allergy status to sulfonamides status: Secondary | ICD-10-CM | POA: Diagnosis not present

## 2018-12-29 DIAGNOSIS — M8580 Other specified disorders of bone density and structure, unspecified site: Secondary | ICD-10-CM | POA: Diagnosis not present

## 2018-12-29 DIAGNOSIS — I428 Other cardiomyopathies: Secondary | ICD-10-CM | POA: Diagnosis not present

## 2018-12-29 DIAGNOSIS — E669 Obesity, unspecified: Secondary | ICD-10-CM | POA: Insufficient documentation

## 2018-12-29 DIAGNOSIS — I10 Essential (primary) hypertension: Secondary | ICD-10-CM | POA: Insufficient documentation

## 2018-12-29 DIAGNOSIS — Z6832 Body mass index (BMI) 32.0-32.9, adult: Secondary | ICD-10-CM | POA: Insufficient documentation

## 2018-12-29 DIAGNOSIS — Z79899 Other long term (current) drug therapy: Secondary | ICD-10-CM | POA: Insufficient documentation

## 2018-12-29 DIAGNOSIS — I471 Supraventricular tachycardia: Secondary | ICD-10-CM | POA: Insufficient documentation

## 2018-12-29 DIAGNOSIS — I447 Left bundle-branch block, unspecified: Secondary | ICD-10-CM | POA: Insufficient documentation

## 2018-12-29 DIAGNOSIS — I060 Rheumatic aortic stenosis: Secondary | ICD-10-CM | POA: Diagnosis not present

## 2018-12-29 DIAGNOSIS — J45909 Unspecified asthma, uncomplicated: Secondary | ICD-10-CM | POA: Insufficient documentation

## 2018-12-29 DIAGNOSIS — M069 Rheumatoid arthritis, unspecified: Secondary | ICD-10-CM | POA: Diagnosis not present

## 2018-12-29 HISTORY — PX: RIGHT/LEFT HEART CATH AND CORONARY ANGIOGRAPHY: CATH118266

## 2018-12-29 LAB — POCT I-STAT EG7
Acid-Base Excess: 1 mmol/L (ref 0.0–2.0)
Bicarbonate: 26.4 mmol/L (ref 20.0–28.0)
CALCIUM ION: 1.13 mmol/L — AB (ref 1.15–1.40)
HCT: 41 % (ref 36.0–46.0)
Hemoglobin: 13.9 g/dL (ref 12.0–15.0)
O2 Saturation: 76 %
Potassium: 3.5 mmol/L (ref 3.5–5.1)
SODIUM: 143 mmol/L (ref 135–145)
TCO2: 28 mmol/L (ref 22–32)
pCO2, Ven: 45.8 mmHg (ref 44.0–60.0)
pH, Ven: 7.369 (ref 7.250–7.430)
pO2, Ven: 43 mmHg (ref 32.0–45.0)

## 2018-12-29 LAB — POCT I-STAT 7, (LYTES, BLD GAS, ICA,H+H)
Acid-Base Excess: 1 mmol/L (ref 0.0–2.0)
Bicarbonate: 26.8 mmol/L (ref 20.0–28.0)
Calcium, Ion: 1.21 mmol/L (ref 1.15–1.40)
HCT: 41 % (ref 36.0–46.0)
Hemoglobin: 13.9 g/dL (ref 12.0–15.0)
O2 Saturation: 98 %
Potassium: 3.7 mmol/L (ref 3.5–5.1)
Sodium: 141 mmol/L (ref 135–145)
TCO2: 28 mmol/L (ref 22–32)
pCO2 arterial: 44.8 mmHg (ref 32.0–48.0)
pH, Arterial: 7.385 (ref 7.350–7.450)
pO2, Arterial: 106 mmHg (ref 83.0–108.0)

## 2018-12-29 SURGERY — RIGHT/LEFT HEART CATH AND CORONARY ANGIOGRAPHY
Anesthesia: LOCAL

## 2018-12-29 MED ORDER — HEPARIN SODIUM (PORCINE) 1000 UNIT/ML IJ SOLN
INTRAMUSCULAR | Status: AC
  Filled 2018-12-29: qty 1

## 2018-12-29 MED ORDER — SODIUM CHLORIDE 0.9 % IV SOLN: INTRAVENOUS | Status: AC

## 2018-12-29 MED ORDER — FENTANYL CITRATE (PF) 100 MCG/2ML IJ SOLN
INTRAMUSCULAR | Status: AC
  Filled 2018-12-29: qty 2

## 2018-12-29 MED ORDER — HEPARIN SODIUM (PORCINE) 1000 UNIT/ML IJ SOLN
INTRAMUSCULAR | Status: DC | PRN
  Administered 2018-12-29: 4000 [IU] via INTRAVENOUS

## 2018-12-29 MED ORDER — HEPARIN (PORCINE) IN NACL 1000-0.9 UT/500ML-% IV SOLN
INTRAVENOUS | Status: AC
  Filled 2018-12-29: qty 500

## 2018-12-29 MED ORDER — ASPIRIN 81 MG PO CHEW: 81.0000 mg | CHEWABLE_TABLET | ORAL | Status: DC

## 2018-12-29 MED ORDER — IOHEXOL 350 MG/ML SOLN
INTRAVENOUS | Status: DC | PRN
  Administered 2018-12-29: 80 mL via INTRA_ARTERIAL

## 2018-12-29 MED ORDER — MIDAZOLAM HCL 2 MG/2ML IJ SOLN
INTRAMUSCULAR | Status: DC | PRN
  Administered 2018-12-29 (×2): 1 mg via INTRAVENOUS

## 2018-12-29 MED ORDER — SODIUM CHLORIDE 0.9 % IV SOLN: 250.0000 mL | INTRAVENOUS | Status: DC | PRN

## 2018-12-29 MED ORDER — LIDOCAINE HCL (PF) 1 % IJ SOLN
INTRAMUSCULAR | Status: AC
  Filled 2018-12-29: qty 30

## 2018-12-29 MED ORDER — MIDAZOLAM HCL 2 MG/2ML IJ SOLN
INTRAMUSCULAR | Status: AC
  Filled 2018-12-29: qty 2

## 2018-12-29 MED ORDER — SODIUM CHLORIDE 0.9% FLUSH: 3.0000 mL | Freq: Two times a day (BID) | INTRAVENOUS | Status: DC

## 2018-12-29 MED ORDER — VERAPAMIL HCL 2.5 MG/ML IV SOLN
INTRAVENOUS | Status: DC | PRN
  Administered 2018-12-29: 10 mL via INTRA_ARTERIAL

## 2018-12-29 MED ORDER — LIDOCAINE HCL (PF) 1 % IJ SOLN
INTRAMUSCULAR | Status: DC | PRN
  Administered 2018-12-29 (×2): 2 mL

## 2018-12-29 MED ORDER — SODIUM CHLORIDE 0.9% FLUSH: 3.0000 mL | INTRAVENOUS | Status: DC | PRN

## 2018-12-29 MED ORDER — FENTANYL CITRATE (PF) 100 MCG/2ML IJ SOLN
INTRAMUSCULAR | Status: DC | PRN
  Administered 2018-12-29 (×2): 25 ug via INTRAVENOUS

## 2018-12-29 MED ORDER — VERAPAMIL HCL 2.5 MG/ML IV SOLN
INTRAVENOUS | Status: AC
  Filled 2018-12-29: qty 2

## 2018-12-29 MED ORDER — ONDANSETRON HCL 4 MG/2ML IJ SOLN: 4.0000 mg | Freq: Four times a day (QID) | INTRAMUSCULAR | Status: DC | PRN

## 2018-12-29 MED ORDER — HEPARIN (PORCINE) IN NACL 1000-0.9 UT/500ML-% IV SOLN
INTRAVENOUS | Status: DC | PRN
  Administered 2018-12-29 (×2): 500 mL

## 2018-12-29 MED ORDER — ACETAMINOPHEN 325 MG PO TABS: 650.0000 mg | ORAL_TABLET | ORAL | Status: DC | PRN

## 2018-12-29 SURGICAL SUPPLY — 12 items
CATH 5FR JL3.5 JR4 ANG PIG MP (CATHETERS) ×2 IMPLANT
CATH BALLN WEDGE 5F 110CM (CATHETERS) ×2 IMPLANT
DEVICE RAD COMP TR BAND LRG (VASCULAR PRODUCTS) ×2 IMPLANT
GLIDESHEATH SLEND SS 6F .021 (SHEATH) ×2 IMPLANT
GUIDEWIRE INQWIRE 1.5J.035X260 (WIRE) ×1 IMPLANT
INQWIRE 1.5J .035X260CM (WIRE) ×2
KIT HEART LEFT (KITS) ×2 IMPLANT
PACK CARDIAC CATHETERIZATION (CUSTOM PROCEDURE TRAY) ×2 IMPLANT
SHEATH GLIDE SLENDER 4/5FR (SHEATH) ×2 IMPLANT
SYR MEDRAD MARK 7 150ML (SYRINGE) ×2 IMPLANT
TRANSDUCER W/STOPCOCK (MISCELLANEOUS) ×2 IMPLANT
TUBING CIL FLEX 10 FLL-RA (TUBING) ×2 IMPLANT

## 2018-12-29 NOTE — Interval H&P Note (Signed)
History and Physical Interval Note:  12/29/2018 9:55 AM  Madison Gutierrez  has presented today for cardiac cath with the diagnosis of chest pain, cardiomyopathy, aortic stenosis. The various methods of treatment have been discussed with the patient and family. After consideration of risks, benefits and other options for treatment, the patient has consented to  Procedure(s): RIGHT/LEFT HEART CATH AND CORONARY ANGIOGRAPHY (N/A) as a surgical intervention.  The patient's history has been reviewed, patient examined, no change in status, stable for surgery.  I have reviewed the patient's chart and labs.  Questions were answered to the patient's satisfaction.    Cath Lab Visit (complete for each Cath Lab visit)  Clinical Evaluation Leading to the Procedure:   ACS: No.  Non-ACS:    Anginal Classification: CCS II  Anti-ischemic medical therapy: Minimal Therapy (1 class of medications)  Non-Invasive Test Results: No non-invasive testing performed  Prior CABG: No previous CABG        Lauree Chandler

## 2018-12-29 NOTE — Discharge Instructions (Signed)
Radial Site Care ° °This sheet gives you information about how to care for yourself after your procedure. Your health care provider may also give you more specific instructions. If you have problems or questions, contact your health care provider. °What can I expect after the procedure? °After the procedure, it is common to have: °· Bruising and tenderness at the catheter insertion area. °Follow these instructions at home: °Medicines °· Take over-the-counter and prescription medicines only as told by your health care provider. °Insertion site care °· Follow instructions from your health care provider about how to take care of your insertion site. Make sure you: °? Wash your hands with soap and water before you change your bandage (dressing). If soap and water are not available, use hand sanitizer. °? Change your dressing as told by your health care provider. °? Leave stitches (sutures), skin glue, or adhesive strips in place. These skin closures may need to stay in place for 2 weeks or longer. If adhesive strip edges start to loosen and curl up, you may trim the loose edges. Do not remove adhesive strips completely unless your health care provider tells you to do that. °· Check your insertion site every day for signs of infection. Check for: °? Redness, swelling, or pain. °? Fluid or blood. °? Pus or a bad smell. °? Warmth. °· Do not take baths, swim, or use a hot tub until your health care provider approves. °· You may shower 24-48 hours after the procedure, or as directed by your health care provider. °? Remove the dressing and gently wash the site with plain soap and water. °? Pat the area dry with a clean towel. °? Do not rub the site. That could cause bleeding. °· Do not apply powder or lotion to the site. °Activity ° °· For 24 hours after the procedure, or as directed by your health care provider: °? Do not flex or bend the affected arm. °? Do not push or pull heavy objects with the affected arm. °? Do not  drive yourself home from the hospital or clinic. You may drive 24 hours after the procedure unless your health care provider tells you not to. °? Do not operate machinery or power tools. °· Do not lift anything that is heavier than 10 lb (4.5 kg), or the limit that you are told, until your health care provider says that it is safe. °· Ask your health care provider when it is okay to: °? Return to work or school. °? Resume usual physical activities or sports. °? Resume sexual activity. °General instructions °· If the catheter site starts to bleed, raise your arm and put firm pressure on the site. If the bleeding does not stop, get help right away. This is a medical emergency. °· If you went home on the same day as your procedure, a responsible adult should be with you for the first 24 hours after you arrive home. °· Keep all follow-up visits as told by your health care provider. This is important. °Contact a health care provider if: °· You have a fever. °· You have redness, swelling, or yellow drainage around your insertion site. °Get help right away if: °· You have unusual pain at the radial site. °· The catheter insertion area swells very fast. °· The insertion area is bleeding, and the bleeding does not stop when you hold steady pressure on the area. °· Your arm or hand becomes pale, cool, tingly, or numb. °These symptoms may represent a serious problem   that is an emergency. Do not wait to see if the symptoms will go away. Get medical help right away. Call your local emergency services (911 in the [place] Do not drive yourself to the hospital. °Summary °· After the procedure, it is common to have bruising and tenderness at the site. °· Follow instructions from your health care provider about how to take care of your radial site wound. Check the wound every day for signs of infection. °· Do not lift anything that is heavier than 10 lb (4.5 kg), or the limit that you are told, until your health care provider says  that it is safe. °This information is not intended to replace advice given to you by your health care provider. Make sure you discuss any questions you have with your health care provider. °Document Released: 10/30/2010 Document Revised: 11/02/2017 Document Reviewed: 11/02/2017 °Elsevier Interactive Patient Education © 2019 [employer] ° °

## 2019-01-01 ENCOUNTER — Encounter (HOSPITAL_COMMUNITY): Payer: Self-pay | Admitting: Cardiovascular Disease

## 2019-01-08 ENCOUNTER — Telehealth: Payer: Self-pay | Admitting: *Deleted

## 2019-01-08 NOTE — Telephone Encounter (Addendum)
Left message for patient to call back to discuss either cancelling or changing visit to virtual visit on 01/10/19.   This appointment was made as a 4-6 wk f/u--pt was seen again since then, and went to cath lab on 12/29/18.

## 2019-01-09 ENCOUNTER — Telehealth: Payer: Self-pay

## 2019-01-09 NOTE — Telephone Encounter (Signed)
Called pt regarding 4/1 appt with [facility] in attempts to postpone.  Pt is complaining of exertional chest tightness and would like to speak with Dr. Angelena Form about med changes.   Pt agreeable to phone visit. Consent sent via my chart message.

## 2019-01-09 NOTE — Telephone Encounter (Signed)
TELEPHONE CALL NOTE  Madison Gutierrez has been deemed a candidate for a follow-up tele-health visit to limit community exposure during the Covid-19 pandemic. I spoke with the patient via phone to ensure availability of phone/video source, confirm preferred email & phone number, and discuss instructions and expectations.  I reminded Madison Gutierrez to be prepared with any vital sign and/or heart rhythm information that could potentially be obtained via home monitoring, at the time of her visit. I reminded Madison Gutierrez to expect a phone call at the time of her visit if her visit.  Did the patient verbally acknowledge consent to treatment? YES  Wilma Flavin, RN 01/09/2019 10:29 AM

## 2019-01-09 NOTE — Telephone Encounter (Signed)
Pt is okay with cancelling or postponing. She is unable to do a video conference. She just wanted to go over the results of her test with Dr. Julianne Handler , and she said that can even be done on the phone if possible. She was also told that there might be medication she will be put on to help with blockage.

## 2019-01-10 ENCOUNTER — Other Ambulatory Visit: Payer: Self-pay

## 2019-01-10 ENCOUNTER — Encounter: Payer: Self-pay | Admitting: Cardiovascular Disease

## 2019-01-10 ENCOUNTER — Ambulatory Visit: Payer: Medicare Other | Admitting: Cardiovascular Disease

## 2019-01-10 ENCOUNTER — Telehealth (INDEPENDENT_AMBULATORY_CARE_PROVIDER_SITE_OTHER): Payer: Medicare Other | Admitting: Cardiovascular Disease

## 2019-01-10 VITALS — BP 134/93 | HR 76 | Ht 64.0 in | Wt 182.0 lb

## 2019-01-10 DIAGNOSIS — I471 Supraventricular tachycardia, unspecified: Secondary | ICD-10-CM

## 2019-01-10 DIAGNOSIS — I2510 Atherosclerotic heart disease of native coronary artery without angina pectoris: Secondary | ICD-10-CM

## 2019-01-10 DIAGNOSIS — I350 Nonrheumatic aortic (valve) stenosis: Secondary | ICD-10-CM | POA: Diagnosis not present

## 2019-01-10 DIAGNOSIS — I420 Dilated cardiomyopathy: Secondary | ICD-10-CM | POA: Diagnosis not present

## 2019-01-10 DIAGNOSIS — I447 Left bundle-branch block, unspecified: Secondary | ICD-10-CM

## 2019-01-10 MED ORDER — ROSUVASTATIN CALCIUM 10 MG PO TABS: 10.0000 mg | ORAL_TABLET | Freq: Every day | ORAL | 3 refills | Status: DC

## 2019-01-10 NOTE — Progress Notes (Signed)
Hi, Our notes go to the primary care doctors. I don't have any questions for you. Madison Gutierrez

## 2019-01-10 NOTE — Progress Notes (Signed)
Virtual Visit via Telephone Note    Evaluation Performed:  Follow-up visit  This visit type was conducted due to national recommendations for restrictions regarding the COVID-19 Pandemic (e.g. social distancing).  This format is felt to be most appropriate for this patient at this time.  All issues noted in this document were discussed and addressed.  No physical exam was performed (except for noted visual exam findings with Video Visits).  Please refer to the patient's chart (MyChart message for video visits and phone note for telephone visits) for the patient's consent to telehealth for [facility].  Date:  01/10/2019   ID:  Madison Gutierrez, DOB 1945-11-09, MRN [id number]  Patient Location:  [facility]  Provider location:   [facility] office  PCP:  Hoyt Koch, MD  Cardiologist:  Lauree Chandler, MD  Electrophysiologist:  None   Chief Complaint:  Cath follow up, CAD  History of Present Illness:    Madison Gutierrez is a 73 y.o. female who presents via audio/video conferencing for a telehealth visit today.  73 yo female with history of HTN, aortic stenosis, chronic LBBB and SVT who is being seen today via E-visit for cardiac follow up. This E-visit is being conducted during the Covid pandemic. I saw her as a new patient August 2012 for evaluation of palpitations. She was seen by Dr. Modena Morrow on 05/19/11 and EKG with frequent PVCs. I arranged an echo and a 48 hour Holter monitor August 2012. Her echo showed normal LV size and function with EF of 70%. There was mild aortic valve disease with mild stenosis and regurgitation. Her Holter showed normal sinus rhythm at baseline with frequent PVCs and several runs of SVT. She was started on Cardizem. Echo March 2018 with LVEF=60-65%, aortic valve sclerosis with mild aortic stenosis.  I saw her in the office 11/13/18 and she c/o chest pressure and dyspnea with exertion. Echo 11/24/18 with LVEF=35-40% with severe hypokinesis of the  inferoseptal and anteroseptal walls. The aortic leaflets are thickened with restricted opening mean gradient of 14 mmHg c/w mild aortic stenosis. Mild MR. Nuclear stress test 11/24/18 without any large areas of ischemia.  Cardiac cath 12/29/18 with mild non-obstructive CAD and mild aortic stenosis with normal LV systolic function. The echo images were reviewed and I think her LV systolic function is normal, likely read as abnormal due to poor image quality.   She tells me today that she is feeling much better. No chest pain or dyspnea. She is here today for follow up. The patient denies any chest pain, dyspnea, palpitations, lower extremity edema, orthopnea, PND, dizziness, near syncope or syncope.   The patient does not have symptoms concerning for COVID-19 infection (fever, chills, cough, or new shortness of breath).    Prior CV studies:   The following studies were reviewed today:  Echo images, cath images.   Past Medical History:  Diagnosis Date   Asthma    mild asthma from dairy allergies   Elevated LFTs 05/19/11   GERD (gastroesophageal reflux disease)    Heart palpitations    Hepatic steatosis 05/24/11   Hepatitis, unspecified    Hypertension    Obesity    Osteopenia 05/2011   t score -1.9   Psoriasis    Rheumatoid arthritis(714.0)    Staph aureus infection 2007   rt. shin   SVT (supraventricular tachycardia) ([place])    Ulcer    Past Surgical History:  Procedure Laterality Date   APPENDECTOMY  [place]     HERNIA REPAIR  2003   bi-lat  repair    OVARIAN CYST REMOVAL  1978   from left ovary    RIGHT/LEFT HEART CATH AND CORONARY ANGIOGRAPHY N/A 12/29/2018   Procedure: RIGHT/LEFT HEART CATH AND CORONARY ANGIOGRAPHY;  Surgeon: Burnell Blanks, MD;  Location: [facility];  Service: Cardiovascular;  Laterality: N/A;   tummy tuck  2003     Current Meds  Medication Sig   aspirin EC 81 MG tablet Take 1 tablet (81 mg  total) by mouth daily.   Cholecalciferol (VITAMIN D3 PO) Take 2 tablets by mouth daily.   Famotidine (PEPCID PO) Take 1 tablet by mouth daily as needed (for stomach upset).   ibuprofen (ADVIL,MOTRIN) 200 MG tablet Take 400-800 mg by mouth every 6 (six) hours as needed (pain.).   lisinopril (PRINIVIL,ZESTRIL) 2.5 MG tablet TAKE 1 TABLET BY MOUTH ONCE DAILY   LIVER EXTRACT PO Take 1 capsule by mouth daily. Livdetox   metoprolol succinate (TOPROL XL) 25 MG 24 hr tablet Take 1 tablet (25 mg total) by mouth daily.   MILK THISTLE PO Take 1 capsule by mouth daily.   Multiple Vitamin (MULTIVITAMIN WITH MINERALS) TABS tablet Take 1 tablet by mouth daily.   Polyethyl Glycol-Propyl Glycol (SYSTANE) 0.4-0.3 % SOLN Place 1 drop into both eyes 2 (two) times daily as needed (dry/irritated eyes.).     Allergies:   Dairy aid [lactase]; Petrolatum-zinc oxide; Sulfa antibiotics; and Sulfamethoxazole   Social History   Tobacco Use   Smoking status: Never Smoker   Smokeless tobacco: Never Used  Substance Use Topics   Alcohol use: Yes    Alcohol/week: 14.0 standard drinks    Types: 14 Glasses of wine per week    Comment: at least two glasses of wine sometimes more nightly   Drug use: No     Family Hx: The patient's family history includes Heart attack in her father and mother; Heart disease in her father and mother; Hypertension in her brother, father, maternal grandmother, and mother; Prostate cancer in her brother. There is no history of Colon cancer.  ROS:   Please see the history of present illness.    All other systems reviewed and are negative.   Labs/Other Tests and Data Reviewed:    Recent Labs: 12/04/2018: BUN 14; Creatinine, Ser 0.69; Platelets 167 12/29/2018: Hemoglobin 13.9; Potassium 3.7; Sodium 141   Recent Lipid Panel Lab Results  Component Value Date/Time   CHOL 186 10/20/2017 01:39 PM   TRIG 101.0 10/20/2017 01:39 PM   HDL 55.40 10/20/2017 01:39 PM   CHOLHDL 3  10/20/2017 01:39 PM   LDLCALC 110 (H) 10/20/2017 01:39 PM    Wt Readings from Last 3 Encounters:  01/10/19 182 lb (82.6 kg)  12/29/18 185 lb (83.9 kg)  12/04/18 185 lb 6.4 oz (84.1 kg)     Objective:    Vital Signs:  BP (!) 134/93    Pulse 76    Ht 5\' 4"  (1.626 m)    Wt 182 lb (82.6 kg)    BMI 31.24 kg/m    No exam performed. Telephone visit only   Echo 11/24/18:  1. The left ventricle has moderately reduced systolic function, with an ejection fraction of 35-40%. The cavity size was normal. There is severely increased left ventricular wall thickness. Left ventricular diastolic Doppler parameters are consistent  with impaired relaxation. Severe hypokinesis of the inferoseptal and anteroseptal walls. Inferior hypokinesis. Septal-lateral  dyssynchrony due to LBBB.  2. The right ventricle has normal systolic function. The cavity was normal. There is no increase in right ventricular wall thickness.  3. Left atrial size was mildly dilated.  4. Trivial pericardial effusion.  5. The mitral valve is normal in structure. There is mild mitral annular calcification present. No evidence of mitral valve stenosis. Mild mitral regurgitation.  6. The tricuspid valve is normal in structure.  7. The aortic valve is tricuspid Moderate calcification of the aortic valve. mild stenosis of the aortic valve. Mean gradient 14 mmHg.  8. The pulmonic valve was normal in structure.  9. Right atrial pressure is estimated at 3 mmHg. 10. No complete TR doppler jet so unable to estimate PA systolic pressure.   ASSESSMENT & PLAN:    1. SVT: No palpitations. Will continue Toprol.   2. HTN: BP is well controlled. No changes  3. Aortic valve stenosis: Mild by echo in February 2020 and cardiac cath March 2020.     4. LBBB: Chronic.    5. Cardiomyopathy: LV systolic function normal by LV gram 12/29/18. Will continue beta blocker and Ace-in  6. CAD without angina: Mild CAD by cath March 2020. Will start Crestor 10  mg daily. Continue ASA and beta blocker. Will need lipids and LFTS in 12 weeks.    COVID-19 Education: The signs and symptoms of COVID-19 were discussed with the patient and how to seek care for testing (follow up with PCP or arrange E-visit).  The importance of social distancing was discussed today.  Patient Risk:   After full review of this patient's clinical status, I feel that they are at least moderate risk at this time.  Time:   Today, I have spent 25 minutes with the patient with telehealth technology discussing her CAD, valve disease and cardiomyopathy. We also discussed her elbow injury in detail.    Medication Adjustments/Labs and Tests Ordered: Current medicines are reviewed at length with the patient today.  Concerns regarding medicines are outlined above.  Tests Ordered: No orders of the defined types were placed in this encounter.  Medication Changes: No orders of the defined types were placed in this encounter.   Disposition:  Follow up in 6 month(s)  Signed, Lauree Chandler, MD  01/10/2019 8:54 AM    [facility]

## 2019-01-10 NOTE — Patient Instructions (Signed)
Medication Instructions:  Your physician has recommended you make the following change in your medication:  1.) start rosuvastatin (Crestor) 10 mg once a day for cholesterol  If you need a refill on your cardiac medications before your next appointment, please call your pharmacy.   Lab work: Your physician recommends that you return for lab work in: about 3 months (lipids, liver function panel)  If you have labs (blood work) drawn today and your tests are completely normal, you will receive your results only by: Marland Kitchen MyChart Message (if you have MyChart) OR . A paper copy in the mail If you have any lab test that is abnormal or we need to change your treatment, we will call you to review the results.  Testing/Procedures: none  Follow-Up: At [facility], you and your health needs are our priority.  As part of our continuing mission to provide you with exceptional heart care, we have created designated Provider Care Teams.  These Care Teams include your primary Cardiologist (physician) and Advanced Practice Providers (APPs -  Physician Assistants and Nurse Practitioners) who all work together to provide you with the care you need, when you need it. You will need a follow up appointment in 6 months.  Please call our office 2 months in advance to schedule this appointment.  You may see Lauree Chandler, MD or one of the following Advanced Practice Providers on your designated Care Team:   Morrison, PA-C Melina Copa, PA-C . Ermalinda Barrios, PA-C  Any Other Special Instructions Will Be Listed Below (If Applicable).

## 2019-05-14 ENCOUNTER — Telehealth: Payer: Self-pay | Admitting: Cardiovascular Disease

## 2019-05-14 DIAGNOSIS — H43813 Vitreous degeneration, bilateral: Secondary | ICD-10-CM | POA: Diagnosis not present

## 2019-05-14 DIAGNOSIS — H43391 Other vitreous opacities, right eye: Secondary | ICD-10-CM | POA: Diagnosis not present

## 2019-05-14 DIAGNOSIS — H4312 Vitreous hemorrhage, left eye: Secondary | ICD-10-CM | POA: Diagnosis not present

## 2019-05-14 DIAGNOSIS — H3562 Retinal hemorrhage, left eye: Secondary | ICD-10-CM | POA: Diagnosis not present

## 2019-05-14 NOTE — Telephone Encounter (Signed)
LMTCB

## 2019-05-14 NOTE — Telephone Encounter (Signed)
New message   Pt c/o medication issue:  1. Name of Medication: aspirin EC 81 MG tablet  2. How are you currently taking this medication (dosage and times per day)?1 time daily  3. Are you having a reaction (difficulty breathing--STAT)? No   4. What is your medication issue?Patient states that this medication is making her blood too thin. Please contact the patient to discuss.

## 2019-05-15 NOTE — Telephone Encounter (Signed)
Mailbox full 8/4

## 2019-05-17 NOTE — Telephone Encounter (Signed)
Mailbox full

## 2019-05-28 NOTE — Telephone Encounter (Signed)
Mailbox full

## 2019-06-04 NOTE — Telephone Encounter (Signed)
Mailbox full

## 2019-06-26 DIAGNOSIS — H35372 Puckering of macula, left eye: Secondary | ICD-10-CM | POA: Diagnosis not present

## 2019-06-26 DIAGNOSIS — H43812 Vitreous degeneration, left eye: Secondary | ICD-10-CM | POA: Diagnosis not present

## 2019-06-26 DIAGNOSIS — H4312 Vitreous hemorrhage, left eye: Secondary | ICD-10-CM | POA: Diagnosis not present

## 2019-06-28 NOTE — Telephone Encounter (Signed)
Left message to call back  

## 2019-07-30 DIAGNOSIS — L821 Other seborrheic keratosis: Secondary | ICD-10-CM | POA: Diagnosis not present

## 2019-07-30 DIAGNOSIS — L2084 Intrinsic (allergic) eczema: Secondary | ICD-10-CM | POA: Diagnosis not present

## 2019-07-30 DIAGNOSIS — L814 Other melanin hyperpigmentation: Secondary | ICD-10-CM | POA: Diagnosis not present

## 2019-07-30 DIAGNOSIS — L503 Dermatographic urticaria: Secondary | ICD-10-CM | POA: Diagnosis not present

## 2019-08-21 ENCOUNTER — Other Ambulatory Visit: Payer: Self-pay | Admitting: Cardiovascular Disease

## 2019-08-21 ENCOUNTER — Other Ambulatory Visit: Payer: Self-pay | Admitting: Physician Assistant

## 2019-08-24 DIAGNOSIS — Z1231 Encounter for screening mammogram for malignant neoplasm of breast: Secondary | ICD-10-CM | POA: Diagnosis not present

## 2019-08-24 LAB — HM MAMMOGRAPHY

## 2019-10-28 DIAGNOSIS — Z03818 Encounter for observation for suspected exposure to other biological agents ruled out: Secondary | ICD-10-CM | POA: Diagnosis not present

## 2019-10-28 DIAGNOSIS — Z20828 Contact with and (suspected) exposure to other viral communicable diseases: Secondary | ICD-10-CM | POA: Diagnosis not present

## 2019-11-12 ENCOUNTER — Telehealth: Payer: Self-pay | Admitting: Cardiovascular Disease

## 2019-11-12 ENCOUNTER — Encounter: Payer: Self-pay | Admitting: Physician Assistant

## 2019-11-12 NOTE — Progress Notes (Signed)
Cardiology Office Note    Date:  11/14/2019   ID:  BRANDT PORTZ, DOB Feb 02, 1946, MRN [id number]  PCP:  Hoyt Koch, MD  Cardiologist:  Lauree Chandler, MD  Electrophysiologist:  None   Chief Complaint: 6 month f/u, also shortness of breath  History of Present Illness:   Madison Gutierrez is a 74 y.o. female with history of HTN, aortic stenosis, chronic LBBB, SVT, mild nonobstructive CAD who presents for cardiac follow-up and evaluation of SOB.  She established care with Dr. Angelena Form in 2012 for palpitations. Her echo showed normal LV size/function EF 70%, mild AS/AI. Her Holter showed frequent PVCs and several runs of SVT. She was started on Cardizem, later changed to metoprolol. Dr. Angelena Form saw her in the office 11/13/18 and she c/o chest pressure and dyspnea with exertion. Echo 11/24/18 showed LVEF=35-40% with severe hypokinesis of the inferoseptal and anteroseptal walls, mild aortic stenosis, mild mitral regurgitation. Cardiac cath 12/29/18 showed mild non-obstructive CAD and mild aortic stenosis with normal LV systolic function. LVEDP was normal. The echo images were reviewed and Dr. Angelena Form thought her LV systolic function was normal, likely read as abnormal due to poor image quality. Last labs reviewed 12/2018 showed K 3.7, Hgb 13.9, 11/2018 Cr 0.69, 10/2017 LDL 110, A1C wnl, LFTs persistently mildly abnormal.  She returns for evaluation of shortness of breath. This has been present since around September 2020 but has been gradually getting worse. She gets winded with normal ADLs including walking to the mailbox. No specific orthopnea or increase in swelling. She has had frequent, daily chest "aching" ever since her cath, unchanged. It can be worse with exertion. She has also noticed episodic wheezing, but is not wheezing today. She has noticed gradual weight gain over many years' time which is a source of frustration to her, as she has had difficulty losing weight even working with  nutritionist in the past. She has a history of night sweats but not unusual for her since menopause. She has an occasional dry cough which is unchanged. No hemoptysis. In January she had a day where her temperature was 99.6 so she went and got Covid tested which was negative.    Past Medical History:  Diagnosis Date  . Abnormal liver function test   . Asthma    mild asthma from dairy allergies  . Elevated LFTs 05/19/11  . Frequent PVCs   . GERD (gastroesophageal reflux disease)   . Hepatic steatosis 05/24/11  . Hepatitis, unspecified   . Hypertension   . Mild aortic stenosis   . Mild CAD    a. by cath 12/2018.  . Obesity   . Osteopenia 05/2011   t score -1.9  . Psoriasis   . Rheumatoid arthritis(714.0)   . Staph aureus infection 2007   rt. shin  . SVT (supraventricular tachycardia) ([place])   . Ulcer     Past Surgical History:  Procedure Laterality Date  . APPENDECTOMY  1998  . FACIAL RECONSTRUCTION SURGERY    . HERNIA REPAIR  2003   bi-lat  repair   . OVARIAN CYST REMOVAL  1978   from left ovary   . RIGHT/LEFT HEART CATH AND CORONARY ANGIOGRAPHY N/A 12/29/2018   Procedure: RIGHT/LEFT HEART CATH AND CORONARY ANGIOGRAPHY;  Surgeon: Burnell Blanks, MD;  Location: [facility];  Service: Cardiovascular;  Laterality: N/A;  . tummy tuck  2003    Current Medications: Current Meds  Medication Sig  . Cholecalciferol (VITAMIN D3 PO) Take 2  tablets by mouth daily.  Marland Kitchen lisinopril (ZESTRIL) 2.5 MG tablet Take 1 tablet by mouth once daily  . LIVER EXTRACT PO Take 1 capsule by mouth daily. Livdetox  . metoprolol succinate (TOPROL-XL) 25 MG 24 hr tablet TAKE 1 TABLET BY MOUTH ONCE DAILY(STOP CARDIZEM)  . MILK THISTLE PO Take 1 capsule by mouth daily.  . Multiple Vitamin (MULTIVITAMIN WITH MINERALS) TABS tablet Take 1 tablet by mouth daily.  . rosuvastatin (CRESTOR) 10 MG tablet Take 1 tablet (10 mg total) by mouth daily.     Allergies:   Dairy aid [lactase], Petrolatum-zinc  oxide, Sulfa antibiotics, and Sulfamethoxazole   Social History   Socioeconomic History  . Marital status: Married    Spouse name: Not on file  . Number of children: 3  . Years of education: Not on file  . Highest education level: Not on file  Occupational History  . Occupation: retired    Fish farm manager: RETIRED  Tobacco Use  . Smoking status: Never Smoker  . Smokeless tobacco: Never Used  Substance and Sexual Activity  . Alcohol use: Yes    Alcohol/week: 14.0 standard drinks    Types: 14 Glasses of wine per week    Comment: at least two glasses of wine sometimes more nightly  . Drug use: No  . Sexual activity: Not Currently    Partners: Male    Birth control/protection: Post-menopausal  Other Topics Concern  . Not on file  Social History Narrative  . Not on file   Social Determinants of Health   Financial Resource Strain:   . Difficulty of Paying Living Expenses: Not on file  Food Insecurity:   . Worried About Charity fundraiser in the Last Year: Not on file  . Ran Out of Food in the Last Year: Not on file  Transportation Needs:   . Lack of Transportation (Medical): Not on file  . Lack of Transportation (Non-Medical): Not on file  Physical Activity:   . Days of Exercise per Week: Not on file  . Minutes of Exercise per Session: Not on file  Stress:   . Feeling of Stress : Not on file  Social Connections:   . Frequency of Communication with Friends and Family: Not on file  . Frequency of Social Gatherings with Friends and Family: Not on file  . Attends Religious Services: Not on file  . Active Member of Clubs or Organizations: Not on file  . Attends Archivist Meetings: Not on file  . Marital Status: Not on file     Family History:  The patient's family history includes Heart attack in her father and mother; Heart disease in her father and mother; Hypertension in her brother, father, maternal grandmother, and mother; Prostate cancer in her brother. There is  no history of Colon cancer.  ROS:   Please see the history of present illness.  All other systems are reviewed and otherwise negative.    EKGs/Labs/Other Studies Reviewed:    Studies reviewed are outlined and summarized above. Reports included below if pertinent.  12/2018 LHC   Prox RCA lesion is 30% stenosed.  Prox Cx to Mid Cx lesion is 10% stenosed.  Ost LAD to Prox LAD lesion is 30% stenosed.  Ost 1st Mrg lesion is 20% stenosed.  The left ventricular systolic function is normal.  LV end diastolic pressure is normal.  The left ventricular ejection fraction is 50-55% by visual estimate.  There is no mitral valve regurgitation.   1. Mild non-obstructive  CAD 2. Mild aortic stenosis (peak to peak gradient 9 mmHg. 3. Normal LV systolic function Recommendations: Medical management of mild CAD. Will add statin at office follow up. Her LV systolic function is normal by LV gram. I suspect the echo was read as abnormal due to her LBBB. Continue beta blocker and L[report ends]  2D 11/2018 1. The left ventricle has moderately reduced systolic function, with an  ejection fraction of 35-40%. The cavity size was normal. There is severely  increased left ventricular wall thickness. Left ventricular diastolic  Doppler parameters are consistent  with impaired relaxation. Severe hypokinesis of the inferoseptal and  anteroseptal walls. Inferior hypokinesis. Septal-lateral dyssynchrony due  to LBBB.  2. The right ventricle has normal systolic function. The cavity was  normal. There is no increase in right ventricular wall thickness.  3. Left atrial size was mildly dilated.  4. Trivial pericardial effusion.  5. The mitral valve is normal in structure. There is mild mitral annular  calcification present. No evidence of mitral valve stenosis. Mild mitral  regurgitation.  6. The tricuspid valve is normal in structure.  7. The aortic valve is tricuspid Moderate calcification of the  aortic  valve. mild stenosis of the aortic valve. Mean gradient 14 mmHg.  8. The pulmonic valve was normal in structure.  9. Right atrial pressure is estimated at 3 mmHg.  10. No complete TR doppler jet so unable to estimate PA systolic pressure.      EKG:  EKG is ordered today, personally reviewed, demonstrating sinus tach 97bpm, LBBB, nonspecific STT changes associated with LBBB.  Recent Labs: 12/04/2018: BUN 14; Creatinine, Ser 0.69; Platelets 167 12/29/2018: Hemoglobin 13.9; Potassium 3.7; Sodium 141  Recent Lipid Panel    Component Value Date/Time   CHOL 186 10/20/2017 1339   TRIG 101.0 10/20/2017 1339   HDL 55.40 10/20/2017 1339   CHOLHDL 3 10/20/2017 1339   VLDL 20.2 10/20/2017 1339   LDLCALC 110 (H) 10/20/2017 1339    PHYSICAL EXAM:    VS:  BP (!) 150/94   Pulse 90   Ht 5\' 4"  (1.626 m)   Wt 203 lb 12.8 oz (92.4 kg)   SpO2 97%   BMI 34.98 kg/m   BMI: Body mass index is 34.98 kg/m. -obesity class  GEN: Well nourished, well developed WF, in no acute distress HEENT: normocephalic, atraumatic Neck: no JVD, carotid bruits, or masses Cardiac: RRR; 2/6 SEM at RUSB, preserved S2, no rubs or gallops, trace sockline edema (varicose veins) Respiratory:  clear to auscultation bilaterally, normal work of breathing GI: soft, nontender, nondistended, + BS MS: no deformity or atrophy Skin: warm and dry, no rash Neuro:  Alert and Oriented x 3, Strength and sensation are intact, follows commands Psych: euthymic mood, full affect  Wt Readings from Last 3 Encounters:  11/14/19 203 lb 12.8 oz (92.4 kg)  01/10/19 182 lb (82.6 kg)  12/29/18 185 lb (83.9 kg)     ASSESSMENT & PLAN:   1. Shortness of breath - unclear etiology. Exam generally unrevealing. Initial pulse was 97 with pulse ox 91% after exertion, improved to pulse of 90 and pulse ox of 97% at rest. Will get labs today to include BMET, CBC, BNP. Obtain 2V CXR. Although the chronicity does not specifically fit with PE,  symptoms have been more significant lately with evidence of desaturation with exertion, so will add d-dimer. If elevated, will need CTA to r/o PE (or other occult structural anomaly). Will plan to update 2D echocardiogram to  reassess LV function and aortic stenosis. Some of her dyspnea may be related to uncontrolled HTN in the context of gradual weight gain. I would not typically expect this to cause a desaturation, however. Await labs before making further recommendation on medication adjustment for BP. Given reports of wheezing and family history of asthma, will also place referral to pulmonology to get the ball rolling since this can take several weeks to months to get in. 2. Mild CAD - will start with echocardiogram first. If unrevealing, consider ischemic workup. She has chronic unchanged chest discomfort since 12/2018 when cath did not show any evidence of obstruction. She has been taking a lot of NSAIDS for chronic joint issues so will check Hgb to exclude anemia - hold off aspirin at this time given NSAID use. Check LFTs/lipids today - has not had this done recently. 3. Questionable history of cardiomyopathy - recheck echocardiogram. If she does in fact have a drop in her EF, would consider cMRI to exclude other causes of NICM. 4. Mild aortic stenosis - mild 2/6 SEM on exam. Reassess by echocardiogram. S2 is preserved. 5. Freq PVCs/SVT- generally quiescent on metoprolol, does not seem specifically to be contributing to current presentation. EKG does not show any ectopy and there was none present on exam either.  Disposition: F/u with me on 11/26/19, can be virtual if desired.  Medication Adjustments/Labs and Tests Ordered: Current medicines are reviewed at length with the patient today.  Concerns regarding medicines are outlined above. Medication changes, Labs and Tests ordered today are summarized above and listed in the Patient Instructions accessible in Encounters.   Signed, Charlie Pitter, PA-C    11/14/2019 11:32 AM    [facility] [address], [place], [place]  [address] Phone: [phone]; Fax: [phone]

## 2019-11-12 NOTE — Telephone Encounter (Signed)
I spoke with patient. She is due for follow up in our office. She reports shortness of breath for last 4-5 months. Mostly with exertion but sometimes will occur with rest. Feels winded when walking around house.  Occasional tightness in her chest.  She describes as feeling like when she used to live in a cold climate and she would be outside and would feel a tight feeling in her lungs/chest. Earlier this month was feeling poorly with temp of 99.  Had covid test which was negative.  Oxygen sat at that time was 93%. This has not be rechecked.  Patient reports being overweight. She will keep appointment with Melina Copa, PA on 2/3.

## 2019-11-12 NOTE — Telephone Encounter (Signed)
New message  Pt c/o Shortness Of Breath: STAT if SOB developed within the last 24 hours or pt is noticeably SOB on the phone  1. Are you currently SOB (can you hear that pt is SOB on the phone)? No  2. How long have you been experiencing SOB? A couple years, but has gotten progressively worse.   3. Are you SOB when sitting or when up moving around? Both sitting and up and moving around  4. Are you currently experiencing any other symptoms? SOB   Appointment has been scheduled with Madison Gutierrez on 11/14/19 at 10:45am.

## 2019-11-12 NOTE — Telephone Encounter (Signed)
Thanks Pat

## 2019-11-14 ENCOUNTER — Encounter: Payer: Self-pay | Admitting: Physician Assistant

## 2019-11-14 ENCOUNTER — Ambulatory Visit
Admission: RE | Admit: 2019-11-14 | Discharge: 2019-11-14 | Disposition: A | Discharge status: Home / Self Care | Payer: Medicare Other | Source: Ambulatory Visit | Attending: Physician Assistant | Admitting: Physician Assistant

## 2019-11-14 ENCOUNTER — Other Ambulatory Visit: Payer: Self-pay

## 2019-11-14 ENCOUNTER — Ambulatory Visit: Payer: Medicare Other | Admitting: Physician Assistant

## 2019-11-14 VITALS — BP 150/94 | HR 90 | Ht 64.0 in | Wt 203.8 lb

## 2019-11-14 DIAGNOSIS — I493 Ventricular premature depolarization: Secondary | ICD-10-CM

## 2019-11-14 DIAGNOSIS — I471 Supraventricular tachycardia: Secondary | ICD-10-CM

## 2019-11-14 DIAGNOSIS — I350 Nonrheumatic aortic (valve) stenosis: Secondary | ICD-10-CM

## 2019-11-14 DIAGNOSIS — I2510 Atherosclerotic heart disease of native coronary artery without angina pectoris: Secondary | ICD-10-CM

## 2019-11-14 DIAGNOSIS — I429 Cardiomyopathy, unspecified: Secondary | ICD-10-CM

## 2019-11-14 DIAGNOSIS — R0602 Shortness of breath: Secondary | ICD-10-CM

## 2019-11-14 DIAGNOSIS — R635 Abnormal weight gain: Secondary | ICD-10-CM

## 2019-11-14 LAB — COMPREHENSIVE METABOLIC PANEL
ALT: 94 IU/L — ABNORMAL HIGH (ref 0–32)
AST: 113 IU/L — ABNORMAL HIGH (ref 0–40)
Albumin/Globulin Ratio: 1.6 (ref 1.2–2.2)
Albumin: 4.2 g/dL (ref 3.7–4.7)
Alkaline Phosphatase: 137 IU/L — ABNORMAL HIGH (ref 39–117)
BUN/Creatinine Ratio: 15 (ref 12–28)
BUN: 10 mg/dL (ref 8–27)
Bilirubin Total: 0.5 mg/dL (ref 0.0–1.2)
CO2: 26 mmol/L (ref 20–29)
Calcium: 9.3 mg/dL (ref 8.7–10.3)
Chloride: 102 mmol/L (ref 96–106)
Creatinine, Ser: 0.65 mg/dL (ref 0.57–1.00)
GFR calc Af Amer: 101 mL/min/{1.73_m2} (ref >59)
GFR calc non Af Amer: 88 mL/min/{1.73_m2} (ref >59)
Globulin, Total: 2.7 g/dL (ref 1.5–4.5)
Glucose: 111 mg/dL — ABNORMAL HIGH (ref 65–99)
Potassium: 4.5 mmol/L (ref 3.5–5.2)
Sodium: 142 mmol/L (ref 134–144)
Total Protein: 6.9 g/dL (ref 6.0–8.5)

## 2019-11-14 LAB — CBC
Hematocrit: 43.3 % (ref 34.0–46.6)
Hemoglobin: 14.5 g/dL (ref 11.1–15.9)
MCH: 30.5 pg (ref 26.6–33.0)
MCHC: 33.5 g/dL (ref 31.5–35.7)
MCV: 91 fL (ref 79–97)
Platelets: 145 10*3/uL — ABNORMAL LOW (ref 150–450)
RBC: 4.76 x10E6/uL (ref 3.77–5.28)
RDW: 14.6 % (ref 11.7–15.4)
WBC: 6.5 10*3/uL (ref 3.4–10.8)

## 2019-11-14 LAB — D-DIMER, QUANTITATIVE: D-DIMER: 0.67 mg/L FEU — ABNORMAL HIGH (ref 0.00–0.49)

## 2019-11-14 NOTE — Patient Instructions (Addendum)
Medication Instructions:  Your physician recommends that you continue on your current medications as directed. Please refer to the Current Medication list given to you today.  *If you need a refill on your cardiac medications before your next appointment, please call your pharmacy*  Lab Work: TODAY:  STAT CMET, CBC, & DDIMER... TSH, PRO BNP, & LIPID  If you have labs (blood work) drawn today and your tests are completely normal, you will receive your results only by: Marland Kitchen MyChart Message (if you have MyChart) OR . A paper copy in the mail If you have any lab test that is abnormal or we need to change your treatment, we will call you to review the results.  Testing/Procedures: A chest x-ray takes a picture of the organs and structures inside the chest, including the heart, lungs, and blood vessels. This test can show several things, including, whether the heart is enlarges; whether fluid is building up in the lungs; and whether pacemaker / defibrillator leads are still in place.  Go as soon as you leave here; [place], [address] [id number]   Your physician has requested that you have an echocardiogram  11/21/2018 ARRIVE AT 7:05 FOR THIS. Echocardiography is a painless test that uses sound waves to create images of your heart. It provides your doctor with information about the size and shape of your heart and how well your heart's chambers and valves are working. This procedure takes approximately one hour. There are no restrictions for this procedure.   You have been referred to see a PULMONOLOGIST.  They will call you with an appointment   Follow-Up: At [facility], you and your health needs are our priority.  As part of our continuing mission to provide you with exceptional heart care, we have created designated Provider Care Teams.  These Care Teams include your primary Cardiologist (physician) and Advanced Practice Providers (APPs -  Physician Assistants  and Nurse Practitioners) who all work together to provide you with the care you need, when you need it.  Your next appointment:   11/26/2019 11/15.  You are scheduled for a virtual appointment (do not come to the office for this)  The format for your next appointment:   Virtual Visit   Provider:   Melina Copa, PA-C  Other Instructions  Echocardiogram An echocardiogram is a procedure that uses painless sound waves (ultrasound) to produce an image of the heart. Images from an echocardiogram can provide important information about:  Signs of coronary artery disease (CAD).  Aneurysm detection. An aneurysm is a weak or damaged part of an artery wall that bulges out from the normal force of blood pumping through the body.  Heart size and shape. Changes in the size or shape of the heart can be associated with certain conditions, including heart failure, aneurysm, and CAD.  Heart muscle function.  Heart valve function.  Signs of a past heart attack.  Fluid buildup around the heart.  Thickening of the heart muscle.  A tumor or infectious growth around the heart valves. Tell a health care provider about:  Any allergies you have.  All medicines you are taking, including vitamins, herbs, eye drops, creams, and over-the-counter medicines.  Any blood disorders you have.  Any surgeries you have had.  Any medical conditions you have.  Whether you are pregnant or may be pregnant. What are the risks? Generally, this is a safe procedure. However, problems may occur, including:  Allergic reaction to dye (contrast) that may  be used during the procedure. What happens before the procedure? No specific preparation is needed. You may eat and drink normally. What happens during the procedure?   An IV tube may be inserted into one of your veins.  You may receive contrast through this tube. A contrast is an injection that improves the quality of the pictures from your heart.  A gel will  be applied to your chest.  A wand-like tool (transducer) will be moved over your chest. The gel will help to transmit the sound waves from the transducer.  The sound waves will harmlessly bounce off of your heart to allow the heart images to be captured in real-time motion. The images will be recorded on a computer. The procedure may vary among health care providers and hospitals. What happens after the procedure?  You may return to your normal, everyday life, including diet, activities, and medicines, unless your health care provider tells you not to do that. Summary  An echocardiogram is a procedure that uses painless sound waves (ultrasound) to produce an image of the heart.  Images from an echocardiogram can provide important information about the size and shape of your heart, heart muscle function, heart valve function, and fluid buildup around your heart.  You do not need to do anything to prepare before this procedure. You may eat and drink normally.  After the echocardiogram is completed, you may return to your normal, everyday life, unless your health care provider tells you not to do that. This information is not intended to replace advice given to you by your health care provider. Make sure you discuss any questions you have with your health care provider. Document Revised: 01/18/2019 Document Reviewed: 10/30/2016 Elsevier Patient Education  2020 [facility] TEAM HAS ARRANGED FOR AN E-VISIT FOR YOUR APPOINTMENT - PLEASE REVIEW IMPORTANT INFORMATION BELOW SEVERAL DAYS PRIOR TO YOUR APPOINTMENT  Due to the recent COVID-19 pandemic, we are transitioning in-person office visits to tele-medicine visits in an effort to decrease unnecessary exposure to our patients, their families, and staff. These visits are billed to your insurance just like a normal visit is. We also encourage you to sign up for MyChart if you have not already done so. You will need a smartphone  if possible. For patients that do not have this, we can still complete the visit using a regular telephone but do prefer a smartphone to enable video when possible. You may have a family member that lives with you that can help. If possible, we also ask that you have a blood pressure cuff and scale at home to measure your blood pressure, heart rate and weight prior to your scheduled appointment. Patients with clinical needs that need an in-person evaluation and testing will still be able to come to the office if absolutely necessary. If you have any questions, feel free to call our office.     YOUR PROVIDER WILL BE USING THE FOLLOWING PLATFORM TO COMPLETE YOUR VISIT: *  . IF [email or web address] - The staff will give you instructions on receiving your link to join the meeting the day of your visit.   THE DAY OF YOUR APPOINTMENT  Approximately 15 minutes prior to your scheduled appointment, you will receive a telephone call from one of [facility] team - your caller ID may say "Unknown caller."  Our staff will confirm medications, vital signs for the day and any symptoms you may be experiencing. Please have this information available prior to the time of  visit start. It may also be helpful for you to have a pad of paper and pen handy for any instructions given during your visit. They will also walk you through joining the smartphone meeting if this is a video visit.    CONSENT FOR TELE-HEALTH VISIT - PLEASE REVIEW  I hereby voluntarily request, consent and authorize [facility] and its employed or contracted physicians, physician assistants, nurse practitioners or other licensed health care professionals (the Practitioner), to provide me with telemedicine health care services (the "Services") as deemed necessary by the treating Practitioner. I acknowledge and consent to receive the Services by the Practitioner via telemedicine. I understand that the telemedicine visit will involve communicating with the  Practitioner through live audiovisual communication technology and the disclosure of certain medical information by electronic transmission. I acknowledge that I have been given the opportunity to request an in-person assessment or other available alternative prior to the telemedicine visit and am voluntarily participating in the telemedicine visit.  I understand that I have the right to withhold or withdraw my consent to the use of telemedicine in the course of my care at any time, without affecting my right to future care or treatment, and that the Practitioner or I may terminate the telemedicine visit at any time. I understand that I have the right to inspect all information obtained and/or recorded in the course of the telemedicine visit and may receive copies of available information for a reasonable fee.  I understand that some of the potential risks of receiving the Services via telemedicine include:  Marland Kitchen Delay or interruption in medical evaluation due to technological equipment failure or disruption; . Information transmitted may not be sufficient (e.g. poor resolution of images) to allow for appropriate medical decision making by the Practitioner; and/or  . In rare instances, security protocols could fail, causing a breach of personal health information.  Furthermore, I acknowledge that it is my responsibility to provide information about my medical history, conditions and care that is complete and accurate to the best of my ability. I acknowledge that Practitioner's advice, recommendations, and/or decision may be based on factors not within their control, such as incomplete or inaccurate data provided by me or distortions of diagnostic images or specimens that may result from electronic transmissions. I understand that the practice of medicine is not an exact science and that Practitioner makes no warranties or guarantees regarding treatment outcomes. I acknowledge that I will receive a copy of this  consent concurrently upon execution via email to the email address I last provided but may also request a printed copy by calling the office of [facility].    I understand that my insurance will be billed for this visit.   I have read or had this consent read to me. . I understand the contents of this consent, which adequately explains the benefits and risks of the Services being provided via telemedicine.  . I have been provided ample opportunity to ask questions regarding this consent and the Services and have had my questions answered to my satisfaction. . I give my informed consent for the services to be provided through the use of telemedicine in my medical care  By participating in this telemedicine visit I agree to the above.

## 2019-11-15 LAB — LIPID PANEL
Chol/HDL Ratio: 2.5 ratio (ref 0.0–4.4)
Cholesterol, Total: 131 mg/dL (ref 100–199)
HDL: 52 mg/dL (ref >39)
LDL Chol Calc (NIH): 62 mg/dL (ref 0–99)
Triglycerides: 87 mg/dL (ref 0–149)
VLDL Cholesterol Cal: 17 mg/dL (ref 5–40)

## 2019-11-15 LAB — TSH: TSH: 1.47 u[IU]/mL (ref 0.450–4.500)

## 2019-11-15 LAB — PRO B NATRIURETIC PEPTIDE: NT-Pro BNP: 778 pg/mL — ABNORMAL HIGH (ref 0–301)

## 2019-11-20 ENCOUNTER — Telehealth: Payer: Self-pay | Admitting: Physician Assistant

## 2019-11-20 DIAGNOSIS — Z79899 Other long term (current) drug therapy: Secondary | ICD-10-CM

## 2019-11-20 NOTE — Telephone Encounter (Signed)
New Message   Patient is returning call for lab results. Please call.

## 2019-11-20 NOTE — Telephone Encounter (Signed)
Returned pt's call, it goes straight to voicemail and voicemail is full so couldn't leave a message.

## 2019-11-21 MED ORDER — CHLORTHALIDONE 25 MG PO TABS: ORAL_TABLET | ORAL | 1 refills | Status: DC

## 2019-11-21 NOTE — Telephone Encounter (Signed)
-----   Message from Charlie Pitter, [place] sent at 11/15/2019  9:48 AM EST ----- Please let pt know few things: - CXR was generally unrevealing - showed mild atelectasis (non-inflation of the lungs at one lung base). Cannot completely exclude infection but this would not explain her symptoms back for the last several months - additionally, lack of persistent fever/chills and normal white blood cell count argues against infection. - blood sugar remains mildly elevated, suggestive of pre-diabetes (was 105 in 2019, 111 on this check) - as mentioned in result note below d-dimer is normal when adjusted for age (100 divided by age) - liver function remains abnormal but is higher than in 2019. It appears to have been elevated even before addition of Crestor so I do not think it is the statin causing this. She needs to limit NSAIDS and Tylenol since these can increase LFTs as well. I would hold Crestor and refer to GI for evaluation. - BNP is elevated which could indicate some extra fluid on board. This could also be a byproduct of her blood pressure being elevated. Please add chlorthalidone 25mg  - take 1/2 tablet for first 3 days and if BP remains >130 on the top number, increase to 1 whole tablet daily - BMET in 1 week - Await echo and f/u as planned.  Dayna

## 2019-11-21 NOTE — Telephone Encounter (Signed)
Patient calling back. She states she cleared her voicemail and would like a call back or a message sent on mychart.

## 2019-11-22 ENCOUNTER — Other Ambulatory Visit (HOSPITAL_COMMUNITY): Payer: Medicare Other

## 2019-11-23 NOTE — Progress Notes (Signed)
Patient was unable to be reached multiple times for her appointment, called 3x and sent mychart message.  This encounter was created in error - please disregard.

## 2019-11-26 ENCOUNTER — Encounter: Payer: Self-pay | Admitting: *Deleted

## 2019-11-26 ENCOUNTER — Ambulatory Visit (HOSPITAL_COMMUNITY): Payer: Medicare Other | Attending: Cardiology

## 2019-11-26 ENCOUNTER — Encounter: Payer: Medicare Other | Admitting: Physician Assistant

## 2019-11-26 ENCOUNTER — Other Ambulatory Visit: Payer: Self-pay

## 2019-11-26 ENCOUNTER — Telehealth: Payer: Medicare Other | Admitting: Physician Assistant

## 2019-11-26 DIAGNOSIS — I471 Supraventricular tachycardia, unspecified: Secondary | ICD-10-CM

## 2019-11-26 DIAGNOSIS — I2510 Atherosclerotic heart disease of native coronary artery without angina pectoris: Secondary | ICD-10-CM

## 2019-11-26 DIAGNOSIS — R635 Abnormal weight gain: Secondary | ICD-10-CM

## 2019-11-26 DIAGNOSIS — I493 Ventricular premature depolarization: Secondary | ICD-10-CM

## 2019-11-26 DIAGNOSIS — R0602 Shortness of breath: Secondary | ICD-10-CM

## 2019-11-26 DIAGNOSIS — I350 Nonrheumatic aortic (valve) stenosis: Secondary | ICD-10-CM

## 2019-11-26 DIAGNOSIS — I429 Cardiomyopathy, unspecified: Secondary | ICD-10-CM | POA: Diagnosis not present

## 2019-11-26 MED ORDER — PERFLUTREN LIPID MICROSPHERE
1.0000 mL | INTRAVENOUS | Status: AC | PRN
  Administered 2019-11-26: 2 mL via INTRAVENOUS

## 2019-11-27 ENCOUNTER — Telehealth: Payer: Self-pay | Admitting: *Deleted

## 2019-11-27 MED ORDER — ENTRESTO 24-26 MG PO TABS: 1.0000 | ORAL_TABLET | Freq: Two times a day (BID) | ORAL | 0 refills | Status: DC

## 2019-11-27 NOTE — Telephone Encounter (Signed)
Spoke to pt re: echocardiogram results below. Pt is aware to d/c the Lisinopril Start Entresto 11/28/2019 in the a.m taking 1 tablet bid. Pt will get BMET 33/21 and have a f/u virtual appt with Melina Copa, PA-C 12/13/2019.

## 2019-11-27 NOTE — Telephone Encounter (Signed)
-----   Message from Charlie Pitter, [place] sent at 11/26/2019  4:38 PM EST ----- Please let patient know her echocardiogram showed that her ejection fraction "squeeze pump function" is 40%, meaning her heart muscle is weak. This could explain her shortness of breath. Interestingly her EF was 35-40% by echo in 11/2018 although Dr. Angelena Form had commented the image quality was suboptimal and he felt it was actually normal. Her EF was reported to be normal by cath in 12/2018. Therefore it is unclear whether this represents an actual change from the study in 11/2018 or a continued abnormality. I did ask echo reader Dr. Aundra Dubin to review the images side by side, so waiting to hear back.  Based on this new information and the fact that she had not yet started chlorthalidone, I would suggest we change ship on our plan. I would instead hold off on chlorthalidone, discontinue lisinopril, and change to Entresto 24/26mg  BID. Delene Loll is a medicine similar to lisinopril but also has a diuretic in it, and is more likely to bring her blood pressure down than the dose of lisinopril she is on. She will need to have a washout period of 36 hours after last dose of lisinopril. I usually tell patients that after their last dose of lisnopril, they can take their first dose of Entresto 2 days later. (So if last dose of lisinopril was this AM, then first dose of Entresto would be AM of 2/17.)  I would ask that she check her blood pressure mid day on this regimen for several days and send [place] in a MyChart reading with her results. Would recommend a BMET in 2 weeks with subsequent virtual follow-up around that time. So she does not necessarily need to keep the visit with Selinda Eon on 2/17 unless she wants to keep it to discuss the echo further.  Dayna

## 2019-11-28 ENCOUNTER — Telehealth: Payer: Medicare Other | Admitting: Physician Assistant

## 2019-11-28 ENCOUNTER — Other Ambulatory Visit: Payer: Medicare Other

## 2019-11-30 NOTE — Telephone Encounter (Signed)
I am off today and won't be back in the office again until Tuesday but saw Ms. Sane's message and called her because I wanted everyone to be on the same page. We recently had issues contacting her for her virtual visit because she had a spam blocker set up that blocked all calls from our office, including for recent results as well. She has fortunately since fixed this thanks to [place] making her aware via her MyChart message. I was able to reach her this morning. We discussed her recent echo results, plan of care, symptoms, and answered all questions. She was grateful for call and agreeable to plan as outlined. Emaree Chiu PA-C

## 2019-12-04 ENCOUNTER — Other Ambulatory Visit: Payer: Medicare Other

## 2019-12-10 ENCOUNTER — Other Ambulatory Visit: Payer: Medicare Other | Admitting: *Deleted

## 2019-12-10 ENCOUNTER — Telehealth: Payer: Self-pay | Admitting: *Deleted

## 2019-12-10 ENCOUNTER — Other Ambulatory Visit: Payer: Self-pay

## 2019-12-10 DIAGNOSIS — Z79899 Other long term (current) drug therapy: Secondary | ICD-10-CM

## 2019-12-10 DIAGNOSIS — I2510 Atherosclerotic heart disease of native coronary artery without angina pectoris: Secondary | ICD-10-CM | POA: Diagnosis not present

## 2019-12-10 DIAGNOSIS — I429 Cardiomyopathy, unspecified: Secondary | ICD-10-CM

## 2019-12-10 DIAGNOSIS — I10 Essential (primary) hypertension: Secondary | ICD-10-CM

## 2019-12-10 NOTE — Telephone Encounter (Signed)
Per DPR it is OK to leave message on patient's voicemail.  Message left that Dr Angelena Form recommended no changes at this time and to follow up later this week as planned.

## 2019-12-10 NOTE — Telephone Encounter (Signed)
Need to discuss my medication...numbers are all over the map and WAY too low at bedtime.  For example, yesterday 7:30 am - 134/80 11:30 pm - 114/68 11:59 pm - 105/61  Today - 7:30 am - 141/93 And I feel horrible  Above copied from my chart message from patient.   I spoke with patient who reports she feels dizzy at times and like something is not right. Last night her BP was 105/61. She felt shaky and woozy.  This AM her BP was 141/93 and an hour and a half later it was 123/78. She is worried this is too low.  She has not taken her morning medications yet.  I assured her the last BP reading this AM is OK but she is concerned about change from earlier today. Per previous my chart message patient was to check BP readings and send to office.  She has been doing this.  I asked her to send readings though my chart. Patient is scheduled for BMP on 3/3 and virtual visit with Melina Copa, PA on 3/4

## 2019-12-10 NOTE — Telephone Encounter (Signed)
I am out of the office until tomorrow but came online to review inbox - wanted to address this myself since I saw a telephone note also about this patient earlier.   I will send this to Fraser Din since she got the phone call earlier.  Please let Ms. Davidson know first I'm actually quite pleased with the direction her blood pressures have gone compared to where she was when she started. I see she had some concerns about it dropping low, but as long as it's over 100 I am OK with that. She is quite nervous about her blood pressure in general. Please reassure her it is expected to see some blood pressure fluctuation throughout the day. I appreciate she keeps a close eye on it. However, I do see reports that she is feeling "horrible" - our goal is to make her feel better, not worse. I can't specifically explain why she did OK with low dose lisinopril but feels so terrible with the switch to [facility]. I think she's given it an adequate amount of time to get acclimated and she just does not seem to be tolerating it.  At this point I think we should discontinue Entresto - can you try to get her in today for a BMET, CBC, and pBNP today for her dizziness just to ensure no acute trends? Based on these labs I will consider going back to lisinopril at a higher dose vs trial of spironolactone instead. She should NOT restart lisinopril until she hears from [place]. She will need a washout period again going back and forth between the two anyway, and should be advised she may see higher blood pressures for the days that we stop this - not to panic. Fraser Din, can you touch base with our pharmacy team to ask what the washout period would be going from Entresto back to ACEI? It's 36 hours going from ACEI to [facility], but we rarely have to backtrack the other way.  Otherwise keep f/u as scheduled for 3/4 - I anticipate we'll need to order a cMRI to exclude amyloid as a unifying diagnosis with cardiomyopathy, LBBB, and inability to tolerate  med increase. I briefly discussed this with her on the phone a few weeks ago and can revisit at that visit.  Dayna Dunn PA-C   Above copied from my chart message from [place], [place].  I spoke with patient and gave her this information. She will come in today for lab work at 2:30.  Patient aware to stop Entresto and make no other changes to her medications. Patient will follow up with Melina Copa, PA as planned on 3/4  Will forward to pharmacist.

## 2019-12-10 NOTE — Telephone Encounter (Signed)
You are correct. We needs 36hours wash-out period going from [place] to ACEi

## 2019-12-10 NOTE — Progress Notes (Signed)
Virtual Visit via Telephone Note   This visit type was conducted due to national recommendations for restrictions regarding the COVID-19 Pandemic (e.g. social distancing) in an effort to limit this patient's exposure and mitigate transmission in our community.  Due to her co-morbid illnesses, this patient is at least at moderate risk for complications without adequate follow up.  This format is felt to be most appropriate for this patient at this time.  The patient did not have access to video technology/had technical difficulties with video requiring transitioning to audio format only (telephone).  All issues noted in this document were discussed and addressed.  No physical exam could be performed with this format.  Consent was obtained at time of in-office last visit 11/14/19. [facility].  Date:  12/13/2019   ID:  Madison Gutierrez, DOB 08-19-1946, MRN [id number]  Patient Location: Home Provider Location: [facility]  PCP:  Hoyt Koch, MD  Cardiologist:  Lauree Chandler, MD  Electrophysiologist:  None   Evaluation Performed:  Follow-Up Visit  Chief Complaint: f/u cardiomyopathy  History of Present Illness:    Madison Gutierrez is a 74 y.o. female with HTN, mild aortic stenosis (not present on 11/2019 echo), mild mitral regurgitation, chronic LBBB, SVT, mild nonobstructive CAD, NICM, mildly dilated aortic root who presents for ongoing follow-up of cardiomyopathy and shortness of breath.  She established care with Dr. Angelena Form in 2012 for palpitations. Her echo showed normal LV size/function EF 70%, mild AS/AI. Her Holter showed frequent PVCs and several runs of SVT. She was started on Cardizem, later changed to metoprolol. Dr. Angelena Form saw her in the office 11/13/18 and she c/o chest pressure and dyspnea with exertion. Echo 11/24/18 showed LVEF 35-40% with severe hypokinesis of the inferoseptal and anteroseptal walls, mild aortic stenosis, and mild mitral regurgitation. Cardiac  cath 12/29/18 showed mild non-obstructive CAD and mild aortic stenosis with normal LV systolic function. LVEDP was normal. Dr. Angelena Form reviewed her echo and felt her LV systolic function was normal, suspected read as abnormal due to poor image quality.  She recently returned for follow-up recently 11/14/19 reporting worsening dyspnea on exertion in a gradual continuum from the year prior. Her blood pressure was not well controlled. She also noted frequent, daily chest "aching" unchanged ever since before her cath, not anginal or exertional in nature. She also reported episodic wheezing. She had gained weight but the patient strongly felt this was body weight gain rather than fluid weight gain. Per EMR her weight has fluctuated over the last few years, with peak weight 207 in 2016, 182 in 01/2019 last year, and now 203. She had been dieting since menopause due to weight fluctuations and often cites this as a major source of stress during visits. She did not have any orthopnea or significant edema  My initial plan was to add chlorthalidone for BP control until I saw her echo come back with continued low EF, felt to be accurate per discussion with echo reader. 2D echo 11/26/19 showed EF 40% with global hypokinesis with septal-lateral dyssynchrony consistent with LBBB, grade 1 DD, mild MR, mild dilation of aortic root. Therefore we attempted to change low dose lisinopril to [facility].   Unfortunately she did not tolerate Entresto in the least, even after giving time to acclimate. She felt weak, dizzy, and "horrible" frequently. Although her BNP came down she said it did nothing for her dyspnea on exertion. She was anxious about blood pressure swings to the 100-110 range over 60s. Therefore  we discontinued Entresto and had her return to lisinopril after washout, which she has restarted today. She felt significantly better a good 24 hours after stopping Entresto. With her first dose this morning, her blood went from  150s/90s and is coming down to 141/90. I discussed her case with Dr. Angelena Form and we elected to add low dose spironolactone. Her pharmacy had not yet filled this yet. Aside from continued DOE, she has no acute complaints today.  Last Labs: Last recent labs showed Hgb 14.9, Plt 144, d-dimer 0.67 felt OK for age, TSH wnl, BNP 778 with repeat labs 3/1 with BNP down to 303, K 4.4, Cr 0.56.   Past Medical History:  Diagnosis Date  . Abnormal liver function test   . Asthma    mild asthma from dairy allergies  . Elevated LFTs 05/19/11  . Frequent PVCs   . GERD (gastroesophageal reflux disease)   . Hepatic steatosis 05/24/11  . Hepatitis, unspecified   . Hypertension   . Mild aortic stenosis   . Mild CAD    a. by cath 12/2018.  . Obesity   . Osteopenia 05/2011   t score -1.9  . Psoriasis   . Rheumatoid arthritis(714.0)   . Staph aureus infection 2007   rt. shin  . SVT (supraventricular tachycardia) ([place])   . Ulcer    Past Surgical History:  Procedure Laterality Date  . APPENDECTOMY  1998  . FACIAL RECONSTRUCTION SURGERY    . HERNIA REPAIR  2003   bi-lat  repair   . OVARIAN CYST REMOVAL  1978   from left ovary   . RIGHT/LEFT HEART CATH AND CORONARY ANGIOGRAPHY N/A 12/29/2018   Procedure: RIGHT/LEFT HEART CATH AND CORONARY ANGIOGRAPHY;  Surgeon: Burnell Blanks, MD;  Location: [facility];  Service: Cardiovascular;  Laterality: N/A;  . tummy tuck  2003     Current Meds  Medication Sig  . Cholecalciferol (VITAMIN D) 125 MCG (5000 UT) CAPS Take by mouth daily.  . Cholecalciferol (VITAMIN D3 PO) Take 2 tablets by mouth daily.  Marland Kitchen lisinopril (ZESTRIL) 2.5 MG tablet Take 1 tablet (2.5 mg total) by mouth daily.  Marland Kitchen LIVER EXTRACT PO Take 1 capsule by mouth daily. Livdetox  . metoprolol succinate (TOPROL-XL) 25 MG 24 hr tablet TAKE 1 TABLET BY MOUTH ONCE DAILY(STOP CARDIZEM)  . MILK THISTLE PO Take 1 capsule by mouth daily.  . Multiple Vitamin (MULTIVITAMIN WITH MINERALS) TABS  tablet Take 1 tablet by mouth daily.  . rosuvastatin (CRESTOR) 10 MG tablet Take 1 tablet (10 mg total) by mouth daily.  Marland Kitchen spironolactone (ALDACTONE) 25 MG tablet Take 0.5 tablets (12.5 mg total) by mouth daily.     Allergies:   Dairy aid [lactase], Petrolatum-zinc oxide, Sulfa antibiotics, and Sulfamethoxazole   Social History   Tobacco Use  . Smoking status: Never Smoker  . Smokeless tobacco: Never Used  Substance Use Topics  . Alcohol use: Yes    Alcohol/week: 14.0 standard drinks    Types: 14 Glasses of wine per week    Comment: at least two glasses of wine sometimes more nightly  . Drug use: No     Family Hx: The patient's family history includes Heart attack in her father and mother; Heart disease in her father and mother; Hypertension in her brother, father, maternal grandmother, and mother; Prostate cancer in her brother. There is no history of Colon cancer.  ROS:   Please see the history of present illness.    All other  systems reviewed and are negative.   Prior CV studies:    Most recent pertinent cardiac studies are outlined and summarized above. Reports included below if pertinent.  2D echo 11/26/19  1. Left ventricular ejection fraction, by estimation, is 40%. The left  ventricle has mild to moderately decreased function. The left ventricle  demonstrates global hypokinesis with septal-lateral dyssynchrony  consistent with LBBB. There is moderate left  ventricular hypertrophy. Left ventricular diastolic parameters are  consistent with Grade I diastolic dysfunction (impaired relaxation).  2. Right ventricular systolic function is normal. The right ventricular  size is normal. Tricuspid regurgitation signal is inadequate for assessing  PA pressure.  3. The mitral valve is normal in structure and function. Mild mitral  valve regurgitation. No evidence of mitral stenosis.  4. The aortic valve is tricuspid. Aortic valve regurgitation is trivial.  Mild aortic  valve sclerosis is present, with no evidence of aortic valve  stenosis (mean gradient 8 mmHg).  5. Aortic dilatation noted. There is mild dilatation of the aortic root  measuring 37 mm.  6. The inferior vena cava is normal in size with greater than 50%  respiratory variability, suggesting right atrial pressure of 3 mmHg.   Comparison(s): 11/14/18 EF 35-40%. Mild AS 79mmHg mean, 43mmhg peak.   LHC 12/2018  Prox RCA lesion is 30% stenosed.  Prox Cx to Mid Cx lesion is 10% stenosed.  Ost LAD to Prox LAD lesion is 30% stenosed.  Ost 1st Mrg lesion is 20% stenosed.  The left ventricular systolic function is normal.  LV end diastolic pressure is normal.  The left ventricular ejection fraction is 50-55% by visual estimate.  There is no mitral valve regurgitation.   1. Mild non-obstructive CAD 2. Mild aortic stenosis (peak to peak gradient 9 mmHg. 3. Normal LV systolic function   Recommendations: Medical management of mild CAD. Will add statin at office follow up. Her LV systolic function is normal by LV gram. I suspect the echo was read as abnormal due to her LBBB. Continue beta blocker and L    Labs/Other Tests and Data Reviewed:    EKG:  An ECG dated 11/14/19 was personally reviewed today and demonstrated:  sinus tach 97bpm, LBBB, nonspecific STT changes associated with LBBB.  Recent Labs: 11/14/2019: ALT 94; TSH 1.470 12/10/2019: BUN 10; Creatinine, Ser 0.56; Hemoglobin 14.9; NT-Pro BNP 303; Platelets 144; Potassium 4.4; Sodium 142   Recent Lipid Panel Lab Results  Component Value Date/Time   CHOL 131 11/14/2019 11:54 AM   TRIG 87 11/14/2019 11:54 AM   HDL 52 11/14/2019 11:54 AM   CHOLHDL 2.5 11/14/2019 11:54 AM   CHOLHDL 3 10/20/2017 01:39 PM   LDLCALC 62 11/14/2019 11:54 AM    Wt Readings from Last 3 Encounters:  11/14/19 203 lb 12.8 oz (92.4 kg)  01/10/19 182 lb (82.6 kg)  12/29/18 185 lb (83.9 kg)     Objective:    Vital Signs:  BP (!) 156/92 Comment: without  bp med  Pulse 78   Ht 5\' 4"  (1.626 m)   BMI 34.98 kg/m    VS reviewed. General -pleasant female in no acute distress Pulm - No labored breathing, no coughing during visit, no audible wheezing, speaking in full sentences Neuro - A+Ox3, no slurred speech, answers questions appropriately Psych - Pleasant affect     ASSESSMENT & PLAN:    1. Shortness of breath - suspect multifactorial but certainly concerning in the context of persistently low ejection fraction. She did not appear  significantly volume overloaded on initial visit 2/3. CXR showed very mild chronic appearing lung markings and mild atelectasis vs infiltrate but no vascular congestion or effusions. The chronicity of her symptoms did not really fit with infiltrate and she has been afebrile with normal WBC. She has not had any orthopnea or significant edema. I trialed her on Entresto for both her cardiomyopathy and diuretic effect but she did not tolerate this whatsoever. It does seem unusual that she was able to tolerate lisinopril at low dose but not Entresto. I considered changing her from Entresto to a lone ARB (as she has a history of rare dry cough) but given her intolerance to the ARB in [place] I decided not to complicate the picture and have her return to lisinopril 2.5mg  daily. Despite BNP coming down she saw no improvement in dyspnea. Given her elevated BNP I think it would be helpful to trial her on a diuretic and per discussion with Dr. Angelena Form we are going to try spironolactone 12.5mg  daily, starting at very low dose since she is quite sensitive to medication. I have not changed Toprol to carvedilol at this time due to her previous reports of recent wheezing. Will trend BMET and BNP in 5-7 days Given her unexplained cardiomyopathy, LBBB and intolerance to [facility] I have suggested we proceed with cardiac MRI to exclude infiltrative disease. She is agreeable. She has had an MRI before and denied any concerns for claustrophobia.  The patient also usually expresses significant concern about her long term weight gain ever since menopause each visit. Discussed referral to [facility] but she would like to hold off. She is avoiding in-person visits unless absolutely necessary - we called their office and they are not doing virtuals for new patients right now. Ultimately, if dyspnea does not improve and cMRI is unrevealing, may need to repeat RHC to establish her pressures. LVEDP was 62mmHg at [facility] 12/2018. 2. Chronic combined CHF/NICM - follow with medication changes above. Will trend BNP, BMET in 1 week. I asked the patient to send [place] her BP readings Tuesday or Wednesday of next week. I am confident she will keep [place] apprised of any clinical changes. 3. Mild AS/MR - had previous mild AS on echo in the past. Last echo 11/2019 showed aortic sclerosis but no evidence of actual stenosis. Mitral regurgitation was mild. Therefore, valvular disease is not actively contributing to her dyspnea. 4. Essential HTN - follow blood pressure with addition of spironolactone. 5. Mildly dilated aortic root - anticipate following with echocardiogram again in a year, can be arranged closer to that time.   Time:   Today, I have spent 17 minutes with the patient with telehealth technology discussing the above problems. Additional 7 minutes spent reviewing and preparing chart.   Medication Adjustments/Labs and Tests Ordered: Current medicines are reviewed at length with the patient today.  Concerns regarding medicines are outlined above.   Follow Up:  Virtual Visit  2 weeks with Dr. Angelena Form if available. If not, will need to see APP (I do not have clinic again in the near future given [facility] project)  Signed, Charlie Pitter, PA-C  12/13/2019 9:32 AM    [place]

## 2019-12-10 NOTE — Telephone Encounter (Signed)
Hey. I think we should plan to see her as scheduled later this week. No changes. Gerald Stabs

## 2019-12-10 NOTE — Telephone Encounter (Signed)
I am out of the office until tomorrow but came online to review inbox - wanted to address this myself since I saw a telephone note also about this patient earlier.   I will send this to Fraser Din since she got the phone call earlier.  Please let Ms. Sanzari know first I'm actually quite pleased with the direction her blood pressures have gone compared to where she was when she started. I see she had some concerns about it dropping low, but as long as it's over 100 I am OK with that. She is quite nervous about her blood pressure in general. Please reassure her it is expected to see some blood pressure fluctuation throughout the day. I appreciate she keeps a close eye on it. However, I do see reports that she is feeling "horrible" - our goal is to make her feel better, not worse. I can't specifically explain why she did OK with low dose lisinopril but feels so terrible with the switch to [facility]. I think she's given it an adequate amount of time to get acclimated and she just does not seem to be tolerating it.  At this point I think we should discontinue Entresto - can you try to get her in today for a BMET, CBC, and pBNP today for her dizziness just to ensure no acute trends? Based on these labs I will consider going back to lisinopril at a higher dose vs trial of spironolactone instead. She should NOT restart lisinopril until she hears from [place]. She will need a washout period again going back and forth between the two anyway, and should be advised she may see higher blood pressures for the days that we stop this - not to panic. Fraser Din, can you touch base with our pharmacy team to ask what the washout period would be going from Entresto back to ACEI? It's 36 hours going from ACEI to [facility], but we rarely have to backtrack the other way.  Otherwise keep f/u as scheduled for 3/4 - I anticipate we'll need to order a cMRI to exclude amyloid as a unifying diagnosis with cardiomyopathy, LBBB, and inability to tolerate med  increase. I briefly discussed this with her on the phone a few weeks ago and can revisit at that visit.  Cionna Collantes PA-C

## 2019-12-11 ENCOUNTER — Telehealth: Payer: Self-pay | Admitting: *Deleted

## 2019-12-11 LAB — CBC
Hematocrit: 43.7 % (ref 34.0–46.6)
Hemoglobin: 14.9 g/dL (ref 11.1–15.9)
MCH: 31.2 pg (ref 26.6–33.0)
MCHC: 34.1 g/dL (ref 31.5–35.7)
MCV: 92 fL (ref 79–97)
Platelets: 144 10*3/uL — ABNORMAL LOW (ref 150–450)
RBC: 4.77 x10E6/uL (ref 3.77–5.28)
RDW: 13.2 % (ref 11.7–15.4)
WBC: 7.3 10*3/uL (ref 3.4–10.8)

## 2019-12-11 LAB — BASIC METABOLIC PANEL
BUN/Creatinine Ratio: 18 (ref 12–28)
BUN: 10 mg/dL (ref 8–27)
CO2: 25 mmol/L (ref 20–29)
Calcium: 9.3 mg/dL (ref 8.7–10.3)
Chloride: 103 mmol/L (ref 96–106)
Creatinine, Ser: 0.56 mg/dL — ABNORMAL LOW (ref 0.57–1.00)
GFR calc Af Amer: 106 mL/min/{1.73_m2} (ref >59)
GFR calc non Af Amer: 92 mL/min/{1.73_m2} (ref >59)
Glucose: 97 mg/dL (ref 65–99)
Potassium: 4.4 mmol/L (ref 3.5–5.2)
Sodium: 142 mmol/L (ref 134–144)

## 2019-12-11 LAB — PRO B NATRIURETIC PEPTIDE: NT-Pro BNP: 303 pg/mL — ABNORMAL HIGH (ref 0–301)

## 2019-12-11 MED ORDER — SPIRONOLACTONE 25 MG PO TABS: 12.5000 mg | ORAL_TABLET | Freq: Every day | ORAL | 1 refills | Status: DC

## 2019-12-11 MED ORDER — LISINOPRIL 2.5 MG PO TABS: 2.5000 mg | ORAL_TABLET | Freq: Every day | ORAL | 3 refills | Status: DC

## 2019-12-11 NOTE — Telephone Encounter (Signed)
-----   Message from Charlie Pitter, [place] sent at 12/11/2019  3:33 PM EST ----- Thanks for the update! I discussed with Dr. Angelena Form. Let's enact the following: -She may restart previous dose of lisinopril 2.5mg  daily 48 hours after last dose of Entresto -Start trial of spironolactone 12.5mg  daily (1/2 tablet of a 25mg  tablet). Can start this today or tomorrow, her preference - F/u on 3/4 as planned

## 2019-12-12 ENCOUNTER — Other Ambulatory Visit: Payer: Medicare Other

## 2019-12-13 ENCOUNTER — Other Ambulatory Visit: Payer: Self-pay

## 2019-12-13 ENCOUNTER — Encounter: Payer: Self-pay | Admitting: *Deleted

## 2019-12-13 ENCOUNTER — Telehealth (INDEPENDENT_AMBULATORY_CARE_PROVIDER_SITE_OTHER): Payer: Medicare Other | Admitting: Physician Assistant

## 2019-12-13 ENCOUNTER — Encounter: Payer: Self-pay | Admitting: Physician Assistant

## 2019-12-13 VITALS — BP 156/92 | HR 78 | Ht 64.0 in | Wt 203.0 lb

## 2019-12-13 DIAGNOSIS — I10 Essential (primary) hypertension: Secondary | ICD-10-CM

## 2019-12-13 DIAGNOSIS — I428 Other cardiomyopathies: Secondary | ICD-10-CM

## 2019-12-13 DIAGNOSIS — I77810 Thoracic aortic ectasia: Secondary | ICD-10-CM

## 2019-12-13 DIAGNOSIS — I5042 Chronic combined systolic (congestive) and diastolic (congestive) heart failure: Secondary | ICD-10-CM

## 2019-12-13 DIAGNOSIS — I110 Hypertensive heart disease with heart failure: Secondary | ICD-10-CM

## 2019-12-13 DIAGNOSIS — R0602 Shortness of breath: Secondary | ICD-10-CM | POA: Diagnosis not present

## 2019-12-13 DIAGNOSIS — I350 Nonrheumatic aortic (valve) stenosis: Secondary | ICD-10-CM

## 2019-12-13 DIAGNOSIS — I340 Nonrheumatic mitral (valve) insufficiency: Secondary | ICD-10-CM

## 2019-12-13 NOTE — Patient Instructions (Addendum)
Medication Instructions:  Your physician has recommended you make the following change in your medication:  1.  PICK up the Spironolactone and start this when you pick it up   *If you need a refill on your cardiac medications before your next appointment, please call your pharmacy*   Lab Work: 12/20/2019:  COME TO THE OFFICE FOR A BMET  If you have labs (blood work) drawn today and your tests are completely normal, you will receive your results only by: Marland Kitchen MyChart Message (if you have MyChart) OR . A paper copy in the mail If you have any lab test that is abnormal or we need to change your treatment, we will call you to review the results.   Testing/Procedures: Your physician has requested that you have a cardiac MRI. Cardiac MRI uses a computer to create images of your heart as its beating, producing both still and moving pictures of your heart and major blood vessels. For further information please visit [email or web address] Please follow the instruction sheet given to you today for more information.   Follow-Up: At [facility], you and your health needs are our priority.  As part of our continuing mission to provide you with exceptional heart care, we have created designated Provider Care Teams.  These Care Teams include your primary Cardiologist (physician) and Advanced Practice Providers (APPs -  Physician Assistants and Nurse Practitioners) who all work together to provide you with the care you need, when you need it.  We recommend signing up for the patient portal called "MyChart".  Sign up information is provided on this After Visit Summary.  MyChart is used to connect with patients for Virtual Visits (Telemedicine).  Patients are able to view lab/test results, encounter notes, upcoming appointments, etc.  Non-urgent messages can be sent to your provider as well.   To learn more about what you can do with MyChart, go to [email or web address].    Your next appointment:   2 week(s)    12/26/2019 ARRIVE AT 3:00       The format for your next appointment:   Virtual Visit   Provider:   Truitt Merle, NP   Other Instructions  Let us know what BPs have been running next Tues/Thurs    Nuclear Medicine Exam A nuclear medicine exam is a safe and painless imaging test. It helps your health care provider detect and diagnose diseases. It also provides information about the ways your organs work and how they are structured. For a nuclear medicine exam, you will be given a radioactive tracer. This substance is absorbed by your body's organs. A large scanning machine detects the tracer and creates pictures of the areas that your health care provider wants to know more about. There are several kinds of nuclear medicine exams. They include the following:  CT scan.  MRI scan.  PET scan.  SPECT scan. Tell your health care provider about:  Any allergies you have.  All medicines you are taking, including vitamins, herbs, eye drops, creams, and over-the-counter medicines.  Any problems you or family members have had with anesthetic medicines.  Any blood disorders you have.  Any surgeries you have had.  Any medical conditions you have.  Whether you are pregnant or may be pregnant.  Whether you are nursing. What are the risks? Generally, this is a safe procedure. However, problems may occur, such as an allergic reaction to the tracer, but this is rare. What happens before the procedure? Medicines Ask your health care provider about:  Changing or stopping your regular medicines. This is especially important if you are taking diabetes medicines or blood thinners.  Taking medicines such as aspirin and ibuprofen. These medicines can thin your blood. Do not take these medicines unless your health care provider tells you to take them.  Taking over-the-counter medicines, vitamins, herbs, and supplements. General instructions  Follow instructions from your health care  provider about eating or drinking restrictions.  Do not wear jewelry.  Wear loose, comfortable clothing. You may be asked to wear a hospital gown for the procedure.  Bring previous imaging studies, such as X-rays, with you to the exam if they are available. What happens during the procedure?   An IV may be inserted into one of your veins.  You will be asked to lie on a table or sit in a chair.  You will be given the radioactive tracer. You may get: ? A pill or liquid to swallow. ? An injection. ? Medicine through your IV. ? A gas to inhale.  A large scanning machine will be used to create images of your body. After the pictures are taken, you may have to wait so your health care provider can make sure that enough images were taken. The procedure may vary among health care providers and hospitals. What happens after the procedure?  You may go home after the procedure and return to your usual activities, unless your health care provider tells you otherwise.  Drink enough water to keep your urine pale yellow. This helps to remove the radioactive tracer from your body.  It is up to you to get the results of your procedure. Ask your health care provider, or the department that is doing the procedure, when your results will be ready.  Get help right away if you have problems breathing. Summary  A nuclear medicine exam is a safe and painless imaging test that provides information about how your organs are working. It is also used to detect and diagnose diseases of various body organs.  Follow your health care provider's instructions about eating and drinking restrictions. Ask whether you should change or stop any medicines.  During the procedure, you will be given a radioactive tracer. A large scanning machine will create images of your body.  You may go home after the procedure and return to your regular activities. Follow your health care provider's instructions.  Get help right  away if you have problems breathing. This information is not intended to replace advice given to you by your health care provider. Make sure you discuss any questions you have with your health care provider. Document Revised: 08/16/2018 Document Reviewed: 08/16/2018 Elsevier Patient Education  [place].

## 2019-12-18 NOTE — Progress Notes (Signed)
Telehealth Visit     Virtual Visit via Video Note   This visit type was conducted due to national recommendations for restrictions regarding the COVID-19 Pandemic (e.g. social distancing) in an effort to limit this patient's exposure and mitigate transmission in our community.  Due to her co-morbid illnesses, this patient is at least at moderate risk for complications without adequate follow up.  This format is felt to be most appropriate for this patient at this time.  All issues noted in this document were discussed and addressed.  A limited physical exam was performed with this format.  Please refer to the patient's chart for her consent to telehealth for [facility].   Evaluation Performed:  Follow-up visit  This visit type was conducted due to national recommendations for restrictions regarding the COVID-19 Pandemic (e.g. social distancing).  This format is felt to be most appropriate for this patient at this time.  All issues noted in this document were discussed and addressed.  No physical exam was performed (except for noted visual exam findings with Video Visits).  Please refer to the patient's chart (MyChart message for video visits and phone note for telephone visits) for the patient's consent to telehealth for [facility].  Date:  12/26/2019   ID:  Madison Gutierrez, DOB 1946-07-15, MRN [id number]  Patient Location:  Home  Provider location:   [facility] Office  PCP:  Hoyt Koch, MD  Cardiologist:  Lauree Chandler, MD  Electrophysiologist:  None   Chief Complaint:  Follow up  History of Present Illness:    Madison Gutierrez is a 74 y.o. female who presents via audio/video conferencing for a telehealth visit today.  Seen for Dr. Angelena Form.   She has a history of HTN, mild AS (but not present on echo from 11/2019), mild MR, chronic LBBB, SVT, mild non obstructive CAD, NICM, mildly dilated aortic root. She has had frquent PVCs and runs of SVT on prior monitor.    Seen by Dr. Angelena Form in 11/2018 with chest pain and DOE - echo with EF down to 35 to 40%.  Cardiac cath 12/29/18 showed mild non-obstructive CAD and mild aortic stenosis with normal LV systolic function. LVEDP was normal. Dr. Angelena Form reviewed her echo and felt her LV systolic function was normal, suspected read as abnormal due to poor image quality.  She was last seen here in the office early last month by [facility] - noted worseing DOE. BP was not controlled. Still with chest pain. She had gained weight. Dayna's initial plan was to add chlorthalidone - however echo came back with continued reduction in EF - felt to be accurate - therefore was changed from ACE to [facility]. She did not tolerate this - felt weak, dizzy, "horrible". Placed back on ACE. Low dose Aldactone was added.   Follow up telehealth visit with Dayna noted from earlier this month. Aldactone was to be added and cardiac MRI to rule out infiltrative process was to be arranged. Cardiac MRI has not been completed.   The patient does not have symptoms concerning for COVID-19 infection (fever, chills, cough, or new shortness of breath).   Seen today by telephone visit. She has consented for this visit. She declined video. Says she is fine. Her left ankle is terribly swollen now - she does not think this is related to the Aldactone and thinks something else is going on. She has been treated by Rheumatology in the past for a similar issue. Sounds like gout.  She had  some old Colchicine on hand and started taking this yesterday. She does not see the point of seeing anyone for this. She notes no improvement in her chest pain or shortness of breath. Cardiac MRI is not until next month. She says she is starving herself to try to lose weight. She is not active. Hungry all the time. Tired. She is not really any better. She is asking about her dose of ACE - says she has had various explanations. Says she is going to go out of this world "incredibly  miserable". Asking about her beta blocker. She does not understand why she is on 3 different BP medicines - why she is on low doses. Why she can't be on less. Sounds like BP is still pretty labile. She thinks she needs more Lisinopril. Not sure how much she is restricting her salt. She cooks with it. She is wanting to take green tea capsules to lose weight. She is basically miserable about her weight and need for weight loss. She is very frustrated with her overall situation and  is not happy and not happy about having to restrict her diet/salt.   Past Medical History:  Diagnosis Date  . Abnormal liver function test   . Asthma    mild asthma from dairy allergies  . Elevated LFTs 05/19/11  . Frequent PVCs   . GERD (gastroesophageal reflux disease)   . Hepatic steatosis 05/24/11  . Hepatitis, unspecified   . Hypertension   . Mild aortic stenosis   . Mild CAD    a. by cath 12/2018.  . Obesity   . Osteopenia 05/2011   t score -1.9  . Psoriasis   . Rheumatoid arthritis(714.0)   . Staph aureus infection 2007   rt. shin  . SVT (supraventricular tachycardia) ([place])   . Ulcer    Past Surgical History:  Procedure Laterality Date  . APPENDECTOMY  1998  . FACIAL RECONSTRUCTION SURGERY    . HERNIA REPAIR  2003   bi-lat  repair   . OVARIAN CYST REMOVAL  1978   from left ovary   . RIGHT/LEFT HEART CATH AND CORONARY ANGIOGRAPHY N/A 12/29/2018   Procedure: RIGHT/LEFT HEART CATH AND CORONARY ANGIOGRAPHY;  Surgeon: Burnell Blanks, MD;  Location: [place] [facility];  Service: Cardiovascular;  Laterality: N/A;  . tummy tuck  2003     Current Meds  Medication Sig  . b complex vitamins tablet Take 1 tablet by mouth daily.  . Cholecalciferol (VITAMIN D) 125 MCG (5000 UT) CAPS Take by mouth daily.  . Cholecalciferol (VITAMIN D3 PO) Take 2 tablets by mouth daily.  Marland Kitchen lisinopril (ZESTRIL) 2.5 MG tablet Take 1 tablet (2.5 mg total) by mouth daily.  Marland Kitchen LIVER EXTRACT PO Take 1 capsule by mouth daily.  Livdetox  . metoprolol succinate (TOPROL-XL) 25 MG 24 hr tablet TAKE 1 TABLET BY MOUTH ONCE DAILY(STOP CARDIZEM)  . MILK THISTLE PO Take 1 capsule by mouth daily.  . Multiple Vitamin (MULTIVITAMIN WITH MINERALS) TABS tablet Take 1 tablet by mouth daily.  . rosuvastatin (CRESTOR) 10 MG tablet Take 1 tablet (10 mg total) by mouth daily.  Marland Kitchen spironolactone (ALDACTONE) 25 MG tablet Take 0.5 tablets (12.5 mg total) by mouth daily.     Allergies:   Dairy aid [lactase], Entresto [sacubitril-valsartan], Petrolatum-zinc oxide, Sulfa antibiotics, and Sulfamethoxazole   Social History   Tobacco Use  . Smoking status: Never Smoker  . Smokeless tobacco: Never Used  Substance Use Topics  . Alcohol use: Yes  Alcohol/week: 14.0 standard drinks    Types: 14 Glasses of wine per week    Comment: at least two glasses of wine sometimes more nightly  . Drug use: No     Family Hx: The patient's family history includes Heart attack in her father and mother; Heart disease in her father and mother; Hypertension in her brother, father, maternal grandmother, and mother; Prostate cancer in her brother. There is no history of Colon cancer.  ROS:   Please see the history of present illness.   All other systems reviewed are negative except for probable gout.    Objective:    Vital Signs:  BP 138/78 Comment: 142/89 @ 12:30 12/26/2019  Pulse 85   Ht 5\' 4"  (1.626 m)   Wt 199 lb (90.3 kg)   BMI 34.16 kg/m    Wt Readings from Last 3 Encounters:  12/26/19 199 lb (90.3 kg)  12/13/19 203 lb (92.1 kg)  11/14/19 203 lb 12.8 oz (92.4 kg)    Alert female in no acute distress. She does not sound short of breath with conversation. She sounds to be very upset and frustrated with her overall situation.    Labs/Other Tests and Data Reviewed:    Lab Results  Component Value Date   WBC 7.3 12/10/2019   HGB 14.9 12/10/2019   HCT 43.7 12/10/2019   PLT 144 (L) 12/10/2019   GLUCOSE 89 12/20/2019   CHOL 131  11/14/2019   TRIG 87 11/14/2019   HDL 52 11/14/2019   LDLCALC 62 11/14/2019   ALT 94 (H) 11/14/2019   AST 113 (H) 11/14/2019   NA 141 12/20/2019   K 4.8 12/20/2019   CL 103 12/20/2019   CREATININE 0.61 12/20/2019   BUN 13 12/20/2019   CO2 24 12/20/2019   TSH 1.470 11/14/2019   INR 1.1 (H) 06/21/2011   HGBA1C 5.4 10/20/2017     BNP (last 3 results) No results for input(s): BNP in the last 8760 hours.  ProBNP (last 3 results) Recent Labs    11/14/19 1154 12/10/19 1456 12/20/19 1105  PROBNP 778* 303* 276      Prior CV studies:    2D echo 11/26/19  1. Left ventricular ejection fraction, by estimation, is 40%. The left  ventricle has mild to moderately decreased function. The left ventricle  demonstrates global hypokinesis with septal-lateral dyssynchrony  consistent with LBBB. There is moderate left  ventricular hypertrophy. Left ventricular diastolic parameters are  consistent with Grade I diastolic dysfunction (impaired relaxation).  2. Right ventricular systolic function is normal. The right ventricular  size is normal. Tricuspid regurgitation signal is inadequate for assessing  PA pressure.  3. The mitral valve is normal in structure and function. Mild mitral  valve regurgitation. No evidence of mitral stenosis.  4. The aortic valve is tricuspid. Aortic valve regurgitation is trivial.  Mild aortic valve sclerosis is present, with no evidence of aortic valve  stenosis (mean gradient 8 mmHg).  5. Aortic dilatation noted. There is mild dilatation of the aortic root  measuring 37 mm.  6. The inferior vena cava is normal in size with greater than 50%  respiratory variability, suggesting right atrial pressure of 3 mmHg.   Comparison(s): 11/14/18 EF 35-40%. Mild AS 65mmHg mean, 76mmhg peak.   LHC 12/2018  Prox RCA lesion is 30% stenosed.  Prox Cx to Mid Cx lesion is 10% stenosed.  Ost LAD to Prox LAD lesion is 30% stenosed.  Ost 1st Mrg lesion is 20%  stenosed.  The left ventricular systolic function is normal.  LV end diastolic pressure is normal.  The left ventricular ejection fraction is 50-55% by visual estimate.  There is no mitral valve regurgitation.  1. Mild non-obstructive CAD 2. Mild aortic stenosis (peak to peak gradient 9 mmHg. 3. Normal LV systolic function   Recommendations: Medical management of mild CAD. Will add statin at office follow up. Her LV systolic function is normal by LV gram. I suspect the echo was read as abnormal due to her LBBB. Continue beta blocker and L   ASSESSMENT & PLAN:    1.  Persistent shortness of breath/DOE - I suspect this is multifactorial and probably driven by weight and sedentary lifestyle - but has EF now at 40% - she has cardiac MRI pending to further define - but not til next month -  she has been started on Aldactone - she is not happy with the lifestyle changes needed - she is frustrated about the medicines she is on. I do not think that a phone visit is very helpful - will see if we can get an in office visit for her.   2. NICM - with chronic systolic HF - struggling with the medical regimen. Unclear to me what her actual volume status is at this time.   3. Noted elevated LFTs - not discussed.   4. Dilated aorta - not discussed.   5. HTN -  Sounds like she has little higher readings at times - could certainly benefit from both more beta blocker and ACE - but needs labs.   6. Obesity - tells me several times during this call that she is "starving herself" - she has seen nutritionist - frustrating for her.   7. Probable gout attack - she is using Colchicine that she had on hand - does not wish to see her PCP  8. COVID-19 Education: Not discussed today.   Patient Risk:   After full review of this patient's clinical status, I feel that they are at least moderate risk at this time.  Time:   Today, I have spent 22 minutes with the patient with telehealth technology  discussing the above issues.     Medication Adjustments/Labs and Tests Ordered: Current medicines are reviewed at length with the patient today.  Concerns regarding medicines are outlined above.   Tests Ordered: No orders of the defined types were placed in this encounter.   Medication Changes: No orders of the defined types were placed in this encounter.   Disposition:  FU with Dr. Angelena Form per her request. She will need lab that day as well to recheck potassium and kidney function.   Patient is agreeable to this plan and will call if any problems develop in the interim.   Amie Critchley, NP  12/26/2019 3:30 PM    [facility]

## 2019-12-20 ENCOUNTER — Other Ambulatory Visit: Payer: Self-pay

## 2019-12-20 ENCOUNTER — Other Ambulatory Visit: Payer: Medicare Other

## 2019-12-20 DIAGNOSIS — I428 Other cardiomyopathies: Secondary | ICD-10-CM | POA: Diagnosis not present

## 2019-12-20 DIAGNOSIS — R0602 Shortness of breath: Secondary | ICD-10-CM

## 2019-12-20 DIAGNOSIS — I340 Nonrheumatic mitral (valve) insufficiency: Secondary | ICD-10-CM

## 2019-12-20 DIAGNOSIS — I10 Essential (primary) hypertension: Secondary | ICD-10-CM

## 2019-12-20 DIAGNOSIS — I350 Nonrheumatic aortic (valve) stenosis: Secondary | ICD-10-CM

## 2019-12-20 DIAGNOSIS — I5042 Chronic combined systolic (congestive) and diastolic (congestive) heart failure: Secondary | ICD-10-CM

## 2019-12-20 DIAGNOSIS — I77810 Thoracic aortic ectasia: Secondary | ICD-10-CM

## 2019-12-21 LAB — BASIC METABOLIC PANEL
BUN/Creatinine Ratio: 21 (ref 12–28)
BUN: 13 mg/dL (ref 8–27)
CO2: 24 mmol/L (ref 20–29)
Calcium: 9.2 mg/dL (ref 8.7–10.3)
Chloride: 103 mmol/L (ref 96–106)
Creatinine, Ser: 0.61 mg/dL (ref 0.57–1.00)
GFR calc Af Amer: 103 mL/min/{1.73_m2} (ref >59)
GFR calc non Af Amer: 90 mL/min/{1.73_m2} (ref >59)
Glucose: 89 mg/dL (ref 65–99)
Potassium: 4.8 mmol/L (ref 3.5–5.2)
Sodium: 141 mmol/L (ref 134–144)

## 2019-12-21 LAB — PRO B NATRIURETIC PEPTIDE: NT-Pro BNP: 276 pg/mL (ref 0–301)

## 2019-12-24 ENCOUNTER — Telehealth: Payer: Self-pay | Admitting: Nurse Practitioner

## 2019-12-24 ENCOUNTER — Encounter: Payer: Self-pay | Admitting: Nurse Practitioner

## 2019-12-24 NOTE — Telephone Encounter (Signed)
Spoke with patient regarding appointment for Cardiac MRI scheduled 01/30/20 at 12:00 pm at [facility]---arrival time is 11:15 am--1st floor radiology---will mail information to patient and it is also in My Chart

## 2019-12-26 ENCOUNTER — Telehealth (INDEPENDENT_AMBULATORY_CARE_PROVIDER_SITE_OTHER): Payer: Medicare Other | Admitting: Nurse Practitioner

## 2019-12-26 ENCOUNTER — Encounter: Payer: Self-pay | Admitting: Nurse Practitioner

## 2019-12-26 ENCOUNTER — Other Ambulatory Visit: Payer: Self-pay

## 2019-12-26 VITALS — BP 138/78 | HR 85 | Ht 64.0 in | Wt 199.0 lb

## 2019-12-26 DIAGNOSIS — R0600 Dyspnea, unspecified: Secondary | ICD-10-CM | POA: Diagnosis not present

## 2019-12-26 DIAGNOSIS — I77810 Thoracic aortic ectasia: Secondary | ICD-10-CM

## 2019-12-26 DIAGNOSIS — I110 Hypertensive heart disease with heart failure: Secondary | ICD-10-CM

## 2019-12-26 DIAGNOSIS — I10 Essential (primary) hypertension: Secondary | ICD-10-CM

## 2019-12-26 DIAGNOSIS — I5022 Chronic systolic (congestive) heart failure: Secondary | ICD-10-CM

## 2019-12-26 DIAGNOSIS — I5042 Chronic combined systolic (congestive) and diastolic (congestive) heart failure: Secondary | ICD-10-CM

## 2019-12-26 DIAGNOSIS — I340 Nonrheumatic mitral (valve) insufficiency: Secondary | ICD-10-CM

## 2019-12-26 DIAGNOSIS — R0602 Shortness of breath: Secondary | ICD-10-CM

## 2019-12-26 DIAGNOSIS — I428 Other cardiomyopathies: Secondary | ICD-10-CM

## 2019-12-26 DIAGNOSIS — E669 Obesity, unspecified: Secondary | ICD-10-CM

## 2019-12-26 DIAGNOSIS — Z7189 Other specified counseling: Secondary | ICD-10-CM

## 2019-12-26 DIAGNOSIS — I350 Nonrheumatic aortic (valve) stenosis: Secondary | ICD-10-CM

## 2019-12-26 NOTE — Patient Instructions (Addendum)
After Visit Summary:  We will be checking the following labs today - NONE  We will recheck lab at your next visit here   Medication Instructions:    Continue with your current medicines.    If you need a refill on your cardiac medications before your next appointment, please call your pharmacy.     Testing/Procedures To Be Arranged:  N/A  Follow-Up:   See Dr. Angelena Form in the next 2 weeks.     At [facility], you and your health needs are our priority.  As part of our continuing mission to provide you with exceptional heart care, we have created designated Provider Care Teams.  These Care Teams include your primary Cardiologist (physician) and Advanced Practice Providers (APPs -  Physician Assistants and Nurse Practitioners) who all work together to provide you with the care you need, when you need it.  Special Instructions:  . Stay safe, stay home, wash your hands for at least 20 seconds and wear a mask when out in public.  . It was good to talk with you today.    Call the [place] office at [phone] if you have any questions, problems or concerns.

## 2020-01-11 ENCOUNTER — Ambulatory Visit: Payer: Medicare Other | Admitting: Cardiovascular Disease

## 2020-01-11 ENCOUNTER — Encounter: Payer: Self-pay | Admitting: Cardiovascular Disease

## 2020-01-11 ENCOUNTER — Other Ambulatory Visit: Payer: Self-pay

## 2020-01-11 VITALS — BP 140/88 | HR 89 | Ht 64.0 in | Wt 206.0 lb

## 2020-01-11 DIAGNOSIS — I428 Other cardiomyopathies: Secondary | ICD-10-CM

## 2020-01-11 DIAGNOSIS — I5042 Chronic combined systolic (congestive) and diastolic (congestive) heart failure: Secondary | ICD-10-CM

## 2020-01-11 DIAGNOSIS — I340 Nonrheumatic mitral (valve) insufficiency: Secondary | ICD-10-CM

## 2020-01-11 DIAGNOSIS — I10 Essential (primary) hypertension: Secondary | ICD-10-CM

## 2020-01-11 DIAGNOSIS — I2510 Atherosclerotic heart disease of native coronary artery without angina pectoris: Secondary | ICD-10-CM

## 2020-01-11 MED ORDER — FUROSEMIDE 20 MG PO TABS: 20.0000 mg | ORAL_TABLET | Freq: Every day | ORAL | 3 refills | Status: DC | PRN

## 2020-01-11 MED ORDER — ROSUVASTATIN CALCIUM 10 MG PO TABS: 10.0000 mg | ORAL_TABLET | Freq: Every day | ORAL | 3 refills | Status: DC

## 2020-01-11 MED ORDER — FUROSEMIDE 20 MG PO TABS: 20.0000 mg | ORAL_TABLET | Freq: Every day | ORAL | 3 refills | Status: DC

## 2020-01-11 MED ORDER — LISINOPRIL 20 MG PO TABS: 20.0000 mg | ORAL_TABLET | Freq: Every day | ORAL | 3 refills | Status: DC

## 2020-01-11 NOTE — Progress Notes (Signed)
Chief Complaint  Patient presents with  . Follow-up    CAD    History of Present Illness: 74 yo female with history of HTN, aortic stenosis, chronic LBBB, mild mitral regurgitation, mild CAD, non-ischemic cardiomyopathy and SVT here today for cardiac follow up. I saw her as a new patient August 2012 for evaluation of palpitations. Her EKG in primary care showed PVCs. I arranged an echo and a 48 hour Holter monitor August 2012. Her echo showed normal LV size and function with EF of 70%. There was mild aortic valve disease with mild stenosis and regurgitation. Her Holter showed normal sinus rhythm at baseline with frequent PVCs and several runs of SVT (longest 104 beats at 177 beats per minute). She was started on Cardizem. Echo March 2018 with LVEF=60-65%, aortic valve sclerosis with mild aortic stenosis.  I saw her in the office 11/13/18 and she c/o chest pressure and dyspnea with exertion. Echo 11/24/18 with LVEF=35-40% with severe hypokinesis of the inferoseptal and anteroseptal walls. Image quality was poor. The aortic leaflets are thickened with restricted opening mean gradient of 14 mmHg c/w mild aortic stenosis. Mild MR. Nuclear stress test 11/24/18 without any large areas of ischemia.  Cardiac cath 12/29/18 showed mild non-obstructive CAD and mild aortic stenosis with normal LV systolic function. LVEDP was normal. She was seen by Melina Copa, PA 11/14/19 and reported worsening dyspnea on exertion in a gradual continuum from the year prior. Her blood pressure was not well controlled. She also noted frequent, daily chest "aching" unchanged ever since before her cath, not anginal or exertional in nature. She also reported episodic wheezing. She had gained weight but the patient strongly felt this was body weight gain rather than fluid weight gain. Echo 11/26/19 showed EF 40% with global hypokinesis with septal-lateral dyssynchrony consistent with LBBB, grade 1 DD, mild MR, mild dilation of aortic root.  Therefore we attempted to change low dose lisinopril to [facility]. She did not tolerate Entresto. She felt weak, dizzy, and "horrible" frequently. Although her BNP came down she said it did nothing for her dyspnea on exertion. She was anxious about blood pressure swings to the 100-110 range over 60s. Therefore we discontinued Entresto and had her return to lisinopril after washout. Aldactone was added.   She is here today for follow up. She recently changed her dose of Lisinopril to 20 mg daily (from 2.5 mg daily) and stopped her Toprol. She thinks this is the right combination. She denies any chest pain, palpitations, lower extremity edema, orthopnea, PND, dizziness, near syncope or syncope. Her dyspnea is much improved.   Primary Care Physician:  Hoyt Koch, MD  Past Medical History:  Diagnosis Date  . Abnormal liver function test   . Asthma    mild asthma from dairy allergies  . Elevated LFTs 05/19/11  . Frequent PVCs   . GERD (gastroesophageal reflux disease)   . Hepatic steatosis 05/24/11  . Hepatitis, unspecified   . Hypertension   . Mild aortic stenosis   . Mild CAD    a. by cath 12/2018.  . Obesity   . Osteopenia 05/2011   t score -1.9  . Psoriasis   . Rheumatoid arthritis(714.0)   . Staph aureus infection 2007   rt. shin  . SVT (supraventricular tachycardia) ([place])   . Ulcer     Past Surgical History:  Procedure Laterality Date  . APPENDECTOMY  1998  . FACIAL RECONSTRUCTION SURGERY    . HERNIA REPAIR  2003  bi-lat  repair   . OVARIAN CYST REMOVAL  1978   from left ovary   . RIGHT/LEFT HEART CATH AND CORONARY ANGIOGRAPHY N/A 12/29/2018   Procedure: RIGHT/LEFT HEART CATH AND CORONARY ANGIOGRAPHY;  Surgeon: Burnell Blanks, MD;  Location: [facility];  Service: Cardiovascular;  Laterality: N/A;  . tummy tuck  2003    Current Outpatient Medications  Medication Sig Dispense Refill  . b complex vitamins tablet Take 1 tablet by mouth daily.    .  Cholecalciferol (VITAMIN D) 125 MCG (5000 UT) CAPS Take by mouth daily.    Marland Kitchen lisinopril (ZESTRIL) 20 MG tablet Take 1 tablet (20 mg total) by mouth daily. 90 tablet 3  . LIVER EXTRACT PO Take 1 capsule by mouth daily. Livdetox    . MILK THISTLE PO Take 1 capsule by mouth daily.    . Multiple Vitamin (MULTIVITAMIN WITH MINERALS) TABS tablet Take 1 tablet by mouth daily.    . rosuvastatin (CRESTOR) 10 MG tablet Take 1 tablet (10 mg total) by mouth daily. 90 tablet 3  . spironolactone (ALDACTONE) 25 MG tablet Take 0.5 tablets (12.5 mg total) by mouth daily. 45 tablet 1  . furosemide (LASIX) 20 MG tablet Take 1 tablet (20 mg total) by mouth daily as needed. 60 tablet 3  . metoprolol succinate (TOPROL-XL) 25 MG 24 hr tablet TAKE 1 TABLET BY MOUTH ONCE DAILY(STOP CARDIZEM) (Patient not taking: Reported on 01/11/2020) 90 tablet 1   No current facility-administered medications for this visit.    Allergies  Allergen Reactions  . Dairy Aid [Lactase]   . Entresto [Sacubitril-Valsartan]     Weak, shaky, dizzy  . Petrolatum-Zinc Oxide Rash  . Sulfa Antibiotics Rash  . Sulfamethoxazole Rash    Social History   Socioeconomic History  . Marital status: Married    Spouse name: Not on file  . Number of children: 3  . Years of education: Not on file  . Highest education level: Not on file  Occupational History  . Occupation: retired    Fish farm manager: RETIRED  Tobacco Use  . Smoking status: Never Smoker  . Smokeless tobacco: Never Used  Substance and Sexual Activity  . Alcohol use: Yes    Alcohol/week: 14.0 standard drinks    Types: 14 Glasses of wine per week    Comment: at least two glasses of wine sometimes more nightly  . Drug use: No  . Sexual activity: Not Currently    Partners: Male    Birth control/protection: Post-menopausal  Other Topics Concern  . Not on file  Social History Narrative  . Not on file   Social Determinants of Health   Financial Resource Strain:   . Difficulty of  Paying Living Expenses:   Food Insecurity:   . Worried About Charity fundraiser in the Last Year:   . Arboriculturist in the Last Year:   Transportation Needs:   . Film/video editor (Medical):   Marland Kitchen Lack of Transportation (Non-Medical):   Physical Activity:   . Days of Exercise per Week:   . Minutes of Exercise per Session:   Stress:   . Feeling of Stress :   Social Connections:   . Frequency of Communication with Friends and Family:   . Frequency of Social Gatherings with Friends and Family:   . Attends Religious Services:   . Active Member of Clubs or Organizations:   . Attends Archivist Meetings:   Marland Kitchen Marital Status:  Intimate Partner Violence:   . Fear of Current or Ex-Partner:   . Emotionally Abused:   Marland Kitchen Physically Abused:   . Sexually Abused:     Family History  Problem Relation Age of Onset  . Hypertension Father   . Heart disease Father   . Heart attack Father   . Hypertension Mother   . Heart disease Mother   . Heart attack Mother   . Hypertension Brother   . Prostate cancer Brother   . Hypertension Maternal Grandmother   . Colon cancer Neg Hx     Review of Systems:  As stated in the HPI and otherwise negative.   BP 140/88   Pulse 89   Ht 5\' 4"  (1.626 m)   Wt 206 lb (93.4 kg)   SpO2 97%   BMI 35.36 kg/m   Physical Examination: General: Well developed, well nourished, NAD  HEENT: OP clear, mucus membranes moist  SKIN: warm, dry. No rashes. Neuro: No focal deficits  Musculoskeletal: Muscle strength 5/5 all ext  Psychiatric: Mood and affect normal  Neck: No JVD, no carotid bruits, no thyromegaly, no lymphadenopathy.  Lungs:Clear bilaterally, no wheezes, rhonci, crackles Cardiovascular: Regular rate and rhythm. No murmurs, gallops or rubs. Abdomen:Soft. Bowel sounds present. Non-tender.  Extremities: No lower extremity edema. Pulses are 2 + in the bilateral DP/PT.  Echo 11/26/19:   1. Left ventricular ejection fraction, by  estimation, is 40%. The left  ventricle has mild to moderately decreased function. The left ventricle  demonstrates global hypokinesis with septal-lateral dyssynchrony  consistent with LBBB. There is moderate left  ventricular hypertrophy. Left ventricular diastolic parameters are  consistent with Grade I diastolic dysfunction (impaired relaxation).  2. Right ventricular systolic function is normal. The right ventricular  size is normal. Tricuspid regurgitation signal is inadequate for assessing  PA pressure.  3. The mitral valve is normal in structure and function. Mild mitral  valve regurgitation. No evidence of mitral stenosis.  4. The aortic valve is tricuspid. Aortic valve regurgitation is trivial.  Mild aortic valve sclerosis is present, with no evidence of aortic valve  stenosis (mean gradient 8 mmHg).  5. Aortic dilatation noted. There is mild dilatation of the aortic root  measuring 37 mm.  6. The inferior vena cava is normal in size with greater than 50%  respiratory variability, suggesting right atrial pressure of 3 mmHg.   Nuclear stress test 11/24/18:  Nuclear stress EF: 31%.  Blood pressure demonstrated a normal response to exercise.  There was no ST segment deviation noted during stress.  Defect 1: There is a small defect of moderate severity present in the mid anterior, apical anterior and apex location.  Findings consistent with prior myocardial infarction.  This is a high risk study.  The left ventricular ejection fraction is moderately decreased (30-44%).   There is a small, moderate, fixed perfusion defect in the anterior wall at the mid ventricle and apex.  There is an associated wall motion abnormality, however the entire ventricle is hypokinetic.  This fixed perfusion defect may be due to artifact from breast attenuation.  Left ventricular ejection fraction calculates to 31%.  Underlying left bundle branch block.  EKG:  EKG is not ordered today. The  ekg ordered today demonstrates   Recent Labs: 11/14/2019: ALT 94; TSH 1.470 12/10/2019: Hemoglobin 14.9; Platelets 144 12/20/2019: BUN 13; Creatinine, Ser 0.61; NT-Pro BNP 276; Potassium 4.8; Sodium 141   Lipid Panel    Component Value Date/Time   CHOL 131 11/14/2019 1154  TRIG 87 11/14/2019 1154   HDL 52 11/14/2019 1154   CHOLHDL 2.5 11/14/2019 1154   CHOLHDL 3 10/20/2017 1339   VLDL 20.2 10/20/2017 1339   LDLCALC 62 11/14/2019 1154     Wt Readings from Last 3 Encounters:  01/11/20 206 lb (93.4 kg)  12/26/19 199 lb (90.3 kg)  12/13/19 203 lb (92.1 kg)     Other studies Reviewed: Additional studies/ records that were reviewed today include: . Review of the above records demonstrates:    Assessment and Plan:   1. SVT: No palpitations. She stopped her Toprol.   2. HTN: BP is mostly controlled at home. Continue Lisinopril and Aldactone.   3. Aortic valve stenosis: Mild by echo in February 2021.     4. LBBB: Chronic.    5. Non-ischemic Cardiomyopathy: LVEF=40% by echo February 2021. Cardiac MRI pending in two weeks. She is on an Ace-inh. Did not tolerate Entresto. She has stopped Toprol and does not wish to restart.   6. Chronic combined systolic and diastolic CHF: Weight is up a few lbs but she contributes this to low metabolism and lack of exercise. Will give her Lasix 20 mg to use as needed.   7. CAD without angina: No chest pain. She does not wish to take ASA. Will continue Crestor. She does not wish to take a beta blocker.    Current medicines are reviewed at length with the patient today.  The patient does not have concerns regarding medicines.  The following changes have been made:  no change  Labs/ tests ordered today include:   No orders of the defined types were placed in this encounter.   Disposition:   FU with me in 12  months  Signed, Lauree Chandler, MD 01/11/2020 3:08 PM    [facility] [place], [place], [place]   [address] Phone: [phone]; Fax: [phone]

## 2020-01-11 NOTE — Patient Instructions (Signed)
Medication Instructions:  Your physician has recommended you make the following change in your medication:  START Furosemide (Lasix) 20 mg daily as needed  *If you need a refill on your cardiac medications before your next appointment, please call your pharmacy*   Lab Work: None Ordered If you have labs (blood work) drawn today and your tests are completely normal, you will receive your results only by: Marland Kitchen MyChart Message (if you have MyChart) OR . A paper copy in the mail If you have any lab test that is abnormal or we need to change your treatment, we will call you to review the results.   Testing/Procedures: None Ordered   Follow-Up: At [facility], you and your health needs are our priority.  As part of our continuing mission to provide you with exceptional heart care, we have created designated Provider Care Teams.  These Care Teams include your primary Cardiologist (physician) and Advanced Practice Providers (APPs -  Physician Assistants and Nurse Practitioners) who all work together to provide you with the care you need, when you need it.      Your next appointment:   6 month(s)  The format for your next appointment:   Either In Person or Virtual  Provider:   You may see Lauree Chandler, MD or one of the following Advanced Practice Providers on your designated Care Team:    Melina Copa, PA-C  Ermalinda Barrios, PA-C

## 2020-01-29 ENCOUNTER — Telehealth (HOSPITAL_COMMUNITY): Payer: Self-pay | Admitting: Emergency Medicine

## 2020-01-29 ENCOUNTER — Encounter (HOSPITAL_COMMUNITY): Payer: Self-pay

## 2020-01-29 NOTE — Telephone Encounter (Signed)
Attempted to call patient regarding upcoming cardiac CT appointment. °Left message on voicemail with name and callback number °Dacey Milberger RN Navigator Cardiac Imaging ° Heart and Vascular Services °[phone] Office °[phone] Cell ° °

## 2020-01-30 ENCOUNTER — Ambulatory Visit (HOSPITAL_COMMUNITY)
Admission: RE | Admit: 2020-01-30 | Discharge: 2020-01-30 | Disposition: A | Discharge status: Home / Self Care | Payer: Medicare Other | Source: Ambulatory Visit | Attending: Physician Assistant | Admitting: Physician Assistant

## 2020-01-30 ENCOUNTER — Other Ambulatory Visit: Payer: Self-pay

## 2020-01-30 DIAGNOSIS — R0602 Shortness of breath: Secondary | ICD-10-CM | POA: Diagnosis not present

## 2020-01-30 DIAGNOSIS — I428 Other cardiomyopathies: Secondary | ICD-10-CM | POA: Diagnosis not present

## 2020-01-30 MED ORDER — GADOBUTROL 1 MMOL/ML IV SOLN
10.0000 mL | Freq: Once | INTRAVENOUS | Status: AC | PRN
  Administered 2020-01-30: 10 mL via INTRAVENOUS

## 2020-01-31 ENCOUNTER — Telehealth: Payer: Self-pay | Admitting: Cardiovascular Disease

## 2020-01-31 NOTE — Telephone Encounter (Signed)
Patient returned call to [facility] for results. I transferred patient to [facility].

## 2020-01-31 NOTE — Telephone Encounter (Signed)
Pt returned my call and she has been made ware of her Cardiac MRI.

## 2020-01-31 NOTE — Telephone Encounter (Signed)
-----   Message from Charlie Pitter, [place] sent at 01/31/2020  9:57 AM EDT ----- Please let pt know study suggests that her heart failure is due to elevated blood pressure over time. EF is 42% which is similar to recent studies. There is no evidence to suggest a more concerning process like amyloid which is great news. I saw that she saw Dr. Angelena Form earlier this month and finally found a happy medium in her regimen. Continue his plan as outlined. I'll plan to review study with him when I work with him as well.

## 2020-02-18 ENCOUNTER — Telehealth: Payer: Self-pay | Admitting: *Deleted

## 2020-02-18 NOTE — Telephone Encounter (Signed)
See MyChart message dated 02/17/20

## 2020-02-18 NOTE — Telephone Encounter (Signed)
I called the patient to ask about BP #s, symptoms. She reduced the lisinopril this am by 1/2 to 10 mg. And took spironolactone.  She has felt good today, being active no symptoms.   Is going to stay on lisinopril 10 mg and monitor BP.  She thinks 20 mg is too much.  Will write in on MyChart w an update in couple weeks. Feels bad when 90s/50s. Adv if she would consistently get readings >140/90 to call and further adjustments made need to be made.

## 2020-03-13 DIAGNOSIS — M25572 Pain in left ankle and joints of left foot: Secondary | ICD-10-CM | POA: Diagnosis not present

## 2020-03-15 ENCOUNTER — Ambulatory Visit: Payer: Medicare Other | Attending: Internal Medicine

## 2020-03-15 DIAGNOSIS — Z23 Encounter for immunization: Secondary | ICD-10-CM

## 2020-03-15 NOTE — Progress Notes (Signed)
   Covid-19 Vaccination Clinic  Name:  Madison Gutierrez    MRN: [id number] DOB: January 27, 1946  03/15/2020  [name] was observed post Covid-19 immunization for 30 minutes based on pre-vaccination screening without incident. She was provided with Vaccine Information Sheet and instruction to access the V-Safe system.   [name] was instructed to call 911 with any severe reactions post vaccine: Marland Kitchen Difficulty breathing  . Swelling of face and throat  . A fast heartbeat  . A bad rash all over body  . Dizziness and weakness   Immunizations Administered    Name Date Dose VIS Date Route   JANSSEN COVID-19 VACCINE 03/15/2020 11:46 AM 0.5 mL 12/08/2019 Intramuscular   Manufacturer: [employer]   Lot: [id number]   [place]: [id number]

## 2020-04-10 DIAGNOSIS — M25572 Pain in left ankle and joints of left foot: Secondary | ICD-10-CM | POA: Diagnosis not present

## 2020-04-17 ENCOUNTER — Telehealth: Payer: Self-pay | Admitting: Internal Medicine

## 2020-04-17 NOTE — Telephone Encounter (Signed)
Rec'd from [place] [facility] forwarded 2 pages to Dr. Pricilla Holm

## 2020-05-04 ENCOUNTER — Encounter: Payer: Self-pay | Admitting: Internal Medicine

## 2020-05-06 ENCOUNTER — Ambulatory Visit
Admission: RE | Admit: 2020-05-06 | Discharge: 2020-05-06 | Disposition: A | Discharge status: Home / Self Care | Payer: Medicare Other | Source: Ambulatory Visit | Attending: Physician Assistant | Admitting: Physician Assistant

## 2020-05-06 ENCOUNTER — Other Ambulatory Visit: Payer: Self-pay

## 2020-05-06 VITALS — BP 159/91 | HR 106 | Temp 98.5°F | Resp 18

## 2020-05-06 DIAGNOSIS — R0981 Nasal congestion: Secondary | ICD-10-CM | POA: Diagnosis not present

## 2020-05-06 DIAGNOSIS — R059 Cough, unspecified: Secondary | ICD-10-CM

## 2020-05-06 DIAGNOSIS — J3489 Other specified disorders of nose and nasal sinuses: Secondary | ICD-10-CM | POA: Diagnosis not present

## 2020-05-06 DIAGNOSIS — R05 Cough: Secondary | ICD-10-CM | POA: Diagnosis not present

## 2020-05-06 MED ORDER — FLUTICASONE PROPIONATE 50 MCG/ACT NA SUSP
2.0000 | Freq: Every day | NASAL | 0 refills | Status: DC
Start: 2020-05-06 — End: 2021-04-06

## 2020-05-06 MED ORDER — IPRATROPIUM BROMIDE 0.06 % NA SOLN
2.0000 | Freq: Four times a day (QID) | NASAL | 0 refills | Status: DC
Start: 2020-05-06 — End: 2021-03-02

## 2020-05-06 MED ORDER — DOXYCYCLINE HYCLATE 100 MG PO CAPS
100.0000 mg | ORAL_CAPSULE | Freq: Two times a day (BID) | ORAL | 0 refills | Status: DC
Start: 2020-05-06 — End: 2021-03-02

## 2020-05-06 NOTE — ED Triage Notes (Signed)
Pt c/o productive cough, sore throat, chest congestion, and low grade fever x8days. States is fully covid vaccine.

## 2020-05-06 NOTE — ED Provider Notes (Signed)
[facility]    CSN: [id number] Arrival date & time: 05/06/20  1245      History   Chief Complaint Chief Complaint  Patient presents with  . Cough    HPI Madison Gutierrez is a 74 y.o. female.   74 year old female comes in for 8 day of URI symptoms. Has had productive cough, sore throat, post nasal drip, chest congestion. tmax 99.9. baseline abdominal symptoms without changes. Denies shortness of breath, loss of taste/smell. States mucous was discolored at first, took left over amoxicillin, and color resolved, though still with same amounts of mucous production. Now mucous discolored again. Recent travel to [place].      Past Medical History:  Diagnosis Date  . Abnormal liver function test   . Asthma    mild asthma from dairy allergies  . Elevated LFTs 05/19/11  . Frequent PVCs   . GERD (gastroesophageal reflux disease)   . Hepatic steatosis 05/24/11  . Hepatitis, unspecified   . Hypertension   . Mild aortic stenosis   . Mild CAD    a. by cath 12/2018.  . Obesity   . Osteopenia 05/2011   t score -1.9  . Psoriasis   . Rheumatoid arthritis(714.0)   . Staph aureus infection 2007   rt. shin  . SVT (supraventricular tachycardia) ([place])   . Ulcer     Patient Active Problem List   Diagnosis Date Noted  . Weight gain 11/14/2019  . Dilated cardiomyopathy ([place])   . Right arm pain 05/05/2018  . Acute low back pain 10/21/2017  . Right foot pain 07/09/2015  . Obesity 03/25/2015  . HTN (hypertension) 06/21/2011  . Psoriasis 06/21/2011  . Chronic diarrhea 06/21/2011  . Elevated liver enzymes 06/21/2011  . Palpitations 06/08/2011    Past Surgical History:  Procedure Laterality Date  . APPENDECTOMY  1998  . FACIAL RECONSTRUCTION SURGERY    . HERNIA REPAIR  2003   bi-lat  repair   . OVARIAN CYST REMOVAL  1978   from left ovary   . RIGHT/LEFT HEART CATH AND CORONARY ANGIOGRAPHY N/A 12/29/2018   Procedure: RIGHT/LEFT HEART CATH AND CORONARY ANGIOGRAPHY;  Surgeon:  Burnell Blanks, MD;  Location: [facility];  Service: Cardiovascular;  Laterality: N/A;  . tummy tuck  2003    OB History    Gravida  4   Para  3   Term      Preterm      AB  1   Living        SAB  1   TAB      Ectopic      Multiple      Live Births               Home Medications    Prior to Admission medications   Medication Sig Start Date End Date Taking? Authorizing Provider  b complex vitamins tablet Take 1 tablet by mouth daily.    [provider]  Cholecalciferol (VITAMIN D) 125 MCG (5000 UT) CAPS Take by mouth daily.    [provider]  doxycycline (VIBRAMYCIN) 100 MG capsule Take 1 capsule (100 mg total) by mouth 2 (two) times daily. 05/06/20   Tasia Catchings, Lanita Stammen V, PA-C  fluticasone (FLONASE) 50 MCG/ACT nasal spray Place 2 sprays into both nostrils daily. 05/06/20   Tasia Catchings, Stacye Noori V, PA-C  furosemide (LASIX) 20 MG tablet Take 1 tablet (20 mg total) by mouth daily as needed. 01/11/20 04/10/20  McAlhany,  Annita Brod, MD  ipratropium (ATROVENT) 0.06 % nasal spray Place 2 sprays into both nostrils 4 (four) times daily. 05/06/20   Tasia Catchings, Charvis Lightner V, PA-C  lisinopril (ZESTRIL) 20 MG tablet Take 1 tablet (20 mg total) by mouth daily. Patient taking differently: Take 20 mg by mouth daily.  01/11/20   Burnell Blanks, MD  LIVER EXTRACT PO Take 1 capsule by mouth daily. Livdetox    [provider]  MILK THISTLE PO Take 1 capsule by mouth daily.    [provider]  Multiple Vitamin (MULTIVITAMIN WITH MINERALS) TABS tablet Take 1 tablet by mouth daily.    [provider]  rosuvastatin (CRESTOR) 10 MG tablet Take 1 tablet (10 mg total) by mouth daily. 01/11/20   Burnell Blanks, MD  spironolactone (ALDACTONE) 25 MG tablet Take 0.5 tablets (12.5 mg total) by mouth daily. 12/11/19 03/10/20  Charlie Pitter, PA-C    Family History Family History  Problem Relation Age of Onset  . Hypertension Father   . Heart disease Father   .  Heart attack Father   . Hypertension Mother   . Heart disease Mother   . Heart attack Mother   . Hypertension Brother   . Prostate cancer Brother   . Hypertension Maternal Grandmother   . Colon cancer Neg Hx     Social History Social History   Tobacco Use  . Smoking status: Never Smoker  . Smokeless tobacco: Never Used  Vaping Use  . Vaping Use: Never used  Substance Use Topics  . Alcohol use: Yes    Alcohol/week: 14.0 standard drinks    Types: 14 Glasses of wine per week    Comment: at least two glasses of wine sometimes more nightly  . Drug use: No     Allergies   Dairy aid [lactase], Entresto [sacubitril-valsartan], Petrolatum-zinc oxide, Sulfa antibiotics, and Sulfamethoxazole   Review of Systems Review of Systems  Reason unable to perform ROS: See HPI as above.     Physical Exam Triage Vital Signs ED Triage Vitals  Enc Vitals Group     BP 05/06/20 1306 (!) 159/91     Pulse Rate 05/06/20 1306 (!) 106     Resp 05/06/20 1306 18     Temp 05/06/20 1306 98.5 F (36.9 C)     Temp Source 05/06/20 1306 Oral     SpO2 05/06/20 1306 93 %     Weight --      Height --      Head Circumference --      Peak Flow --      Pain Score 05/06/20 1318 0     Pain Loc --      Pain Edu? --      Excl. in [place]? --    No data found.  Updated Vital Signs BP (!) 159/91 (BP Location: Left Arm)   Pulse (!) 106   Temp 98.5 F (36.9 C) (Oral)   Resp 18   SpO2 93%   Physical Exam Constitutional:      General: She is not in acute distress.    Appearance: Normal appearance. She is well-developed. She is not ill-appearing, toxic-appearing or diaphoretic.  HENT:     Head: Normocephalic and atraumatic.     Right Ear: Ear canal and external ear normal. Tympanic membrane is erythematous. Tympanic membrane is not bulging.     Left Ear: Ear canal and external ear normal. Tympanic membrane is erythematous. Tympanic membrane is not bulging.  Nose:     Right Sinus: No maxillary sinus  tenderness or frontal sinus tenderness.     Left Sinus: No maxillary sinus tenderness or frontal sinus tenderness.     Mouth/Throat:     Mouth: Mucous membranes are moist.     Pharynx: Oropharynx is clear. Uvula midline. Posterior oropharyngeal erythema present.  Eyes:     Conjunctiva/sclera: Conjunctivae normal.     Pupils: Pupils are equal, round, and reactive to light.  Cardiovascular:     Rate and Rhythm: Normal rate and regular rhythm.  Pulmonary:     Effort: Pulmonary effort is normal. No accessory muscle usage, prolonged expiration, respiratory distress or retractions.     Breath sounds: No decreased air movement or transmitted upper airway sounds. No decreased breath sounds.     Comments: LCTAB Musculoskeletal:     Cervical back: Normal range of motion and neck supple.  Skin:    General: Skin is warm and dry.  Neurological:     Mental Status: She is alert and oriented to person, place, and time.      UC Treatments / Results  Labs (all labs ordered are listed, but only abnormal results are displayed) Labs Reviewed  NOVEL CORONAVIRUS, NAA    EKG   Radiology No results found.  Procedures Procedures (including critical care time)  Medications Ordered in UC Medications - No data to display  Initial Impression / Assessment and Plan / UC Course  I have reviewed the triage vital signs and the nursing notes.  Pertinent labs & imaging results that were available during my care of the patient were reviewed by me and considered in my medical decision making (see chart for details).    COVID PCR test ordered. Patient to quarantine until testing results return. Given bilateral erythmatous TM with current symptoms, will cover for bacterial sinusitis with doxycycline. However, discussed this could still be viral illness, other symptomatic treatment discussed.  Push fluids.  Return precautions given.  Patient expresses understanding and agrees to plan.  Final Clinical  Impressions(s) / UC Diagnoses   Final diagnoses:  Nasal congestion  Sinus pressure  Cough   ED Prescriptions    Medication Sig Dispense Auth. Provider   doxycycline (VIBRAMYCIN) 100 MG capsule Take 1 capsule (100 mg total) by mouth 2 (two) times daily. 14 capsule Ceclia Koker V, PA-C   ipratropium (ATROVENT) 0.06 % nasal spray Place 2 sprays into both nostrils 4 (four) times daily. 15 mL Purl Claytor V, PA-C   fluticasone (FLONASE) 50 MCG/ACT nasal spray Place 2 sprays into both nostrils daily. 1 g Ok Edwards, PA-C     PDMP not reviewed this encounter.   Ok Edwards, PA-C 05/06/20 1449

## 2020-05-06 NOTE — Discharge Instructions (Signed)
COVID PCR testing ordered. I would like you to quarantine until testing results. Doxycycline to cover for bacterial sinus infection. However, this could still be a virus causing symptoms. Start flonase, atrovent nasal spray for nasal congestion/drainage. You can use over the counter nasal saline rinse such as neti pot for nasal congestion. Keep hydrated, your urine should be clear to pale yellow in color. Tylenol/motrin for fever and pain. Monitor for any worsening of symptoms, chest pain, shortness of breath, wheezing, swelling of the throat, go to the emergency department for further evaluation needed.

## 2020-05-07 LAB — NOVEL CORONAVIRUS, NAA: SARS-CoV-2, NAA: NOT DETECTED

## 2020-05-07 LAB — SARS-COV-2, NAA 2 DAY TAT

## 2020-06-16 ENCOUNTER — Other Ambulatory Visit: Payer: Self-pay | Admitting: Physician Assistant

## 2020-09-22 ENCOUNTER — Other Ambulatory Visit: Payer: Self-pay | Admitting: Cardiovascular Disease

## 2020-11-17 DIAGNOSIS — L301 Dyshidrosis [pompholyx]: Secondary | ICD-10-CM | POA: Diagnosis not present

## 2020-11-17 DIAGNOSIS — I8393 Asymptomatic varicose veins of bilateral lower extremities: Secondary | ICD-10-CM | POA: Diagnosis not present

## 2020-11-17 DIAGNOSIS — L2089 Other atopic dermatitis: Secondary | ICD-10-CM | POA: Diagnosis not present

## 2020-11-17 DIAGNOSIS — L718 Other rosacea: Secondary | ICD-10-CM | POA: Diagnosis not present

## 2020-12-27 ENCOUNTER — Other Ambulatory Visit: Payer: Self-pay | Admitting: Cardiovascular Disease

## 2021-02-18 ENCOUNTER — Other Ambulatory Visit: Payer: Self-pay | Admitting: Cardiovascular Disease

## 2021-03-02 ENCOUNTER — Encounter: Payer: Self-pay | Admitting: Cardiovascular Disease

## 2021-03-02 ENCOUNTER — Ambulatory Visit: Payer: Medicare Other | Admitting: Cardiovascular Disease

## 2021-03-02 ENCOUNTER — Other Ambulatory Visit: Payer: Self-pay

## 2021-03-02 VITALS — BP 158/80 | HR 82 | Ht 64.0 in | Wt 203.8 lb

## 2021-03-02 DIAGNOSIS — I2510 Atherosclerotic heart disease of native coronary artery without angina pectoris: Secondary | ICD-10-CM

## 2021-03-02 DIAGNOSIS — I10 Essential (primary) hypertension: Secondary | ICD-10-CM

## 2021-03-02 DIAGNOSIS — I471 Supraventricular tachycardia: Secondary | ICD-10-CM

## 2021-03-02 DIAGNOSIS — I350 Nonrheumatic aortic (valve) stenosis: Secondary | ICD-10-CM

## 2021-03-02 DIAGNOSIS — I428 Other cardiomyopathies: Secondary | ICD-10-CM

## 2021-03-02 MED ORDER — SPIRONOLACTONE 25 MG PO TABS: ORAL_TABLET | ORAL | 3 refills | Status: DC

## 2021-03-02 MED ORDER — LISINOPRIL 20 MG PO TABS: 1.0000 | ORAL_TABLET | Freq: Every day | ORAL | 3 refills | Status: DC

## 2021-03-02 NOTE — Patient Instructions (Signed)
Medication Instructions:  No changes *If you need a refill on your cardiac medications before your next appointment, please call your pharmacy*   Lab Work: none If you have labs (blood work) drawn today and your tests are completely normal, you will receive your results only by: . MyChart Message (if you have MyChart) OR . A paper copy in the mail If you have any lab test that is abnormal or we need to change your treatment, we will call you to review the results.   Testing/Procedures: Your physician has requested that you have an echocardiogram. Echocardiography is a painless test that uses sound waves to create images of your heart. It provides your doctor with information about the size and shape of your heart and how well your heart's chambers and valves are working. This procedure takes approximately one hour. There are no restrictions for this procedure.   Follow-Up: At [facility], you and your health needs are our priority.  As part of our continuing mission to provide you with exceptional heart care, we have created designated Provider Care Teams.  These Care Teams include your primary Cardiologist (physician) and Advanced Practice Providers (APPs -  Physician Assistants and Nurse Practitioners) who all work together to provide you with the care you need, when you need it.   Your next appointment:   12 month(s)  The format for your next appointment:   In Person  Provider:   You may see Christopher McAlhany, MD or one of the following Advanced Practice Providers on your designated Care Team:    Dayna Dunn, PA-C  Michele Lenze, PA-C   Other Instructions   

## 2021-03-02 NOTE — Progress Notes (Signed)
Chief Complaint  Patient presents with  . Follow-up    CAD   History of Present Illness: 75 yo female with history of HTN, aortic stenosis, chronic LBBB, mild mitral regurgitation, mild CAD, non-ischemic cardiomyopathy and SVT here today for cardiac follow up. I saw her as a new patient August 2012 for evaluation of palpitations. Her EKG in primary care showed PVCs. I arranged an echo and a 48 hour Holter monitor August 2012. Her echo showed normal LV size and function with EF of 70%. There was mild aortic valve disease with mild stenosis and regurgitation. Her Holter showed normal sinus rhythm at baseline with frequent PVCs and several runs of SVT (longest 104 beats at 177 beats per minute). She was started on Cardizem. Echo March 2018 with LVEF=60-65%, aortic valve sclerosis with mild aortic stenosis.  I saw her in the office 11/13/18 and she c/o chest pressure and dyspnea with exertion. Echo 11/24/18 with LVEF=35-40% with severe hypokinesis of the inferoseptal and anteroseptal walls. Image quality was poor. The aortic leaflets are thickened with restricted opening mean gradient of 14 mmHg c/w mild aortic stenosis. Mild MR. Nuclear stress test 11/24/18 without any large areas of ischemia.  Cardiac cath 12/29/18 showed mild non-obstructive CAD and mild aortic stenosis with normal LV systolic function. LVEDP was normal. She was seen by [name], PA 11/14/19 and reported worsening dyspnea on exertion in a gradual continuum from the year prior. Her blood pressure was not well controlled. She also noted frequent, daily chest "aching" unchanged ever since before her cath, not anginal or exertional in nature. She also reported episodic wheezing. She had gained weight but the patient strongly felt this was body weight gain rather than fluid weight gain. Echo 11/26/19 showed EF 40% with global hypokinesis with septal-lateral dyssynchrony consistent with LBBB, grade 1 DD, mild MR, mild dilation of aortic root. We  attempted to change low dose lisinopril to Entresto. She did not tolerate Entresto. Aldactone was added. She increased her Lisinopril dose and stopped Toprol and her dyspnea improved. Cardiac MRI April 2021 with LVEF=42% with global hypokinesis with asymmetric septal hypertrophy with no SAM or LVOT obstruction. Suggestive of hypertensive heart disease. No findings to suggest an infiltrative process.   She is here today for follow up. The patient denies any chest pain, dyspnea, palpitations, lower extremity edema, orthopnea, PND, dizziness, near syncope or syncope.    Primary Care Physician:  Myrlene Broker, MD  Past Medical History:  Diagnosis Date  . Abnormal liver function test   . Asthma    mild asthma from dairy allergies  . Elevated LFTs 05/19/11  . Frequent PVCs   . GERD (gastroesophageal reflux disease)   . Hepatic steatosis 05/24/11  . Hepatitis, unspecified   . Hypertension   . Mild aortic stenosis   . Mild CAD    a. by cath 12/2018.  . Obesity   . Osteopenia 05/2011   t score -1.9  . Psoriasis   . Rheumatoid arthritis(714.0)   . Staph aureus infection 2007   rt. shin  . SVT (supraventricular tachycardia) ([facility])   . Ulcer     Past Surgical History:  Procedure Laterality Date  . APPENDECTOMY  1998  . FACIAL RECONSTRUCTION SURGERY    . HERNIA REPAIR  2003   bi-lat  repair   . OVARIAN CYST REMOVAL  1978   from left ovary   . RIGHT/LEFT HEART CATH AND CORONARY ANGIOGRAPHY N/A 12/29/2018   Procedure: RIGHT/LEFT HEART CATH  AND CORONARY ANGIOGRAPHY;  Surgeon: Burnell Blanks, MD;  Location: [facility];  Service: Cardiovascular;  Laterality: N/A;  . tummy tuck  2003    Current Outpatient Medications  Medication Sig Dispense Refill  . b complex vitamins tablet Take 1 tablet by mouth daily.    . Cholecalciferol (VITAMIN D) 125 MCG (5000 UT) CAPS Take by mouth daily.    . fluticasone (FLONASE) 50 MCG/ACT nasal spray Place 2 sprays into both nostrils  daily. 1 g 0  . furosemide (LASIX) 20 MG tablet Take 1 tablet (20 mg total) by mouth daily as needed. 60 tablet 3  . LIVER EXTRACT PO Take 1 capsule by mouth daily. Livdetox    . MILK THISTLE PO Take 1 capsule by mouth daily.    . Multiple Vitamin (MULTIVITAMIN WITH MINERALS) TABS tablet Take 1 tablet by mouth daily.    . rosuvastatin (CRESTOR) 10 MG tablet Take 1 tablet by mouth once daily 90 tablet 0  . lisinopril (ZESTRIL) 20 MG tablet Take 1 tablet (20 mg total) by mouth daily. 90 tablet 3  . spironolactone (ALDACTONE) 25 MG tablet Take 1/2 (one-half) tablet by mouth once daily 45 tablet 3   No current facility-administered medications for this visit.    Allergies  Allergen Reactions  . Dairy Aid [Lactase]   . Entresto [Sacubitril-Valsartan]     Weak, shaky, dizzy  . Petrolatum-Zinc Oxide Rash  . Sulfa Antibiotics Rash  . Sulfamethoxazole Rash    Social History   Socioeconomic History  . Marital status: Married    Spouse name: Not on file  . Number of children: 3  . Years of education: Not on file  . Highest education level: Not on file  Occupational History  . Occupation: retired    Fish farm manager: RETIRED  Tobacco Use  . Smoking status: Never Smoker  . Smokeless tobacco: Never Used  Vaping Use  . Vaping Use: Never used  Substance and Sexual Activity  . Alcohol use: Yes    Alcohol/week: 14.0 standard drinks    Types: 14 Glasses of wine per week    Comment: at least two glasses of wine sometimes more nightly  . Drug use: No  . Sexual activity: Not Currently    Partners: Male    Birth control/protection: Post-menopausal  Other Topics Concern  . Not on file  Social History Narrative  . Not on file   Social Determinants of Health   Financial Resource Strain: Not on file  Food Insecurity: Not on file  Transportation Needs: Not on file  Physical Activity: Not on file  Stress: Not on file  Social Connections: Not on file  Intimate Partner Violence: Not on file     Family History  Problem Relation Age of Onset  . Hypertension Father   . Heart disease Father   . Heart attack Father   . Hypertension Mother   . Heart disease Mother   . Heart attack Mother   . Hypertension Brother   . Prostate cancer Brother   . Hypertension Maternal Grandmother   . Colon cancer Neg Hx     Review of Systems:  As stated in the HPI and otherwise negative.   BP (!) 158/80   Pulse 82   Ht 5\' 4"  (1.626 m)   Wt 203 lb 12.8 oz (92.4 kg)   SpO2 98%   BMI 34.98 kg/m   Physical Examination: General: Well developed, well nourished, NAD  HEENT: OP clear, mucus membranes moist  SKIN: warm, dry. No rashes. Neuro: No focal deficits  Musculoskeletal: Muscle strength 5/5 all ext  Psychiatric: Mood and affect normal  Neck: No JVD, no carotid bruits, no thyromegaly, no lymphadenopathy.  Lungs:Clear bilaterally, no wheezes, rhonci, crackles Cardiovascular: Regular rate and rhythm. No murmurs, gallops or rubs. Abdomen:Soft. Bowel sounds present. Non-tender.  Extremities: No lower extremity edema. Pulses are 2 + in the bilateral DP/PT.  Echo 11/26/19: 1. Left ventricular ejection fraction, by estimation, is 40%. The left  ventricle has mild to moderately decreased function. The left ventricle  demonstrates global hypokinesis with septal-lateral dyssynchrony  consistent with LBBB. There is moderate left  ventricular hypertrophy. Left ventricular diastolic parameters are  consistent with Grade I diastolic dysfunction (impaired relaxation).  2. Right ventricular systolic function is normal. The right ventricular  size is normal. Tricuspid regurgitation signal is inadequate for assessing  PA pressure.  3. The mitral valve is normal in structure and function. Mild mitral  valve regurgitation. No evidence of mitral stenosis.  4. The aortic valve is tricuspid. Aortic valve regurgitation is trivial.  Mild aortic valve sclerosis is present, with no evidence of aortic  valve  stenosis (mean gradient 8 mmHg).  5. Aortic dilatation noted. There is mild dilatation of the aortic root  measuring 37 mm.  6. The inferior vena cava is normal in size with greater than 50%  respiratory variability, suggesting right atrial pressure of 3 mmHg.   Nuclear stress test 11/24/18:  Nuclear stress EF: 31%.  Blood pressure demonstrated a normal response to exercise.  There was no ST segment deviation noted during stress.  Defect 1: There is a small defect of moderate severity present in the mid anterior, apical anterior and apex location.  Findings consistent with prior myocardial infarction.  This is a high risk study.  The left ventricular ejection fraction is moderately decreased (30-44%).   There is a small, moderate, fixed perfusion defect in the anterior wall at the mid ventricle and apex.  There is an associated wall motion abnormality, however the entire ventricle is hypokinetic.  This fixed perfusion defect may be due to artifact from breast attenuation.  Left ventricular ejection fraction calculates to 31%.  Underlying left bundle branch block.  EKG:  EKG is  ordered today. The ekg ordered today demonstrates NSR, LBBB  Recent Labs: No results found for requested labs within last 8760 hours.   Lipid Panel    Component Value Date/Time   CHOL 131 11/14/2019 1154   TRIG 87 11/14/2019 1154   HDL 52 11/14/2019 1154   CHOLHDL 2.5 11/14/2019 1154   CHOLHDL 3 10/20/2017 1339   VLDL 20.2 10/20/2017 1339   LDLCALC 62 11/14/2019 1154     Wt Readings from Last 3 Encounters:  03/02/21 203 lb 12.8 oz (92.4 kg)  01/11/20 206 lb (93.4 kg)  12/26/19 199 lb (90.3 kg)     Other studies Reviewed: Additional studies/ records that were reviewed today include: . Review of the above records demonstrates:    Assessment and Plan:   1. SVT: No palpitations. She does not wish to take Toprol and stopped this in 2021.   2. HTN: BP is controlled at home. Continue  Lisinopril and Aldactone.   3. Aortic valve stenosis: Mild by echo in February 2021.  Repeat echo in February 2023.  4. LBBB: Chronic.    5. Non-ischemic Cardiomyopathy: LVEF=40% by echo February 2021. Cardiac MRI April 2021 with LVEF=42% with global hypokinesis with asymmetric septal hypertrophy with no SAM  or LVOT obstruction. Suggestive of hypertensive heart disease. No findings to suggest an infiltrative process. Continue Lisinopril. Did not tolerate Entresto. She has stopped Toprol and does not wish to restart.   6. Chronic combined systolic and diastolic CHF: Weight is stable. Lasix as needed.   7. CAD without angina: She has no chest pain. She does not wish to take ASA or beta blocker. Continue statin.     Current medicines are reviewed at length with the patient today.  The patient does not have concerns regarding medicines.  The following changes have been made:  no change  Labs/ tests ordered today include:   Orders Placed This Encounter  Procedures  . EKG 12-Lead  . ECHOCARDIOGRAM COMPLETE    Disposition:   FU with me in 12  months  Signed, Lauree Chandler, MD 03/02/2021 11:05 AM    [facility] [address], [place], [place]  [address] Phone: [phone]; Fax: [phone]

## 2021-03-05 ENCOUNTER — Telehealth: Payer: Self-pay | Admitting: Internal Medicine

## 2021-03-05 NOTE — Telephone Encounter (Signed)
Fine with me

## 2021-03-05 NOTE — Telephone Encounter (Signed)
Patient is requesting to transfer to Dr. Alain Marion for her care. Stated she had seen Dr. Alain Marion previously and she would like to see him again.  Please advise.

## 2021-03-23 DIAGNOSIS — M25569 Pain in unspecified knee: Secondary | ICD-10-CM

## 2021-03-23 DIAGNOSIS — M25579 Pain in unspecified ankle and joints of unspecified foot: Secondary | ICD-10-CM

## 2021-03-23 DIAGNOSIS — Z8739 Personal history of other diseases of the musculoskeletal system and connective tissue: Secondary | ICD-10-CM

## 2021-03-25 ENCOUNTER — Encounter: Payer: Self-pay | Admitting: Internal Medicine

## 2021-04-06 ENCOUNTER — Ambulatory Visit (INDEPENDENT_AMBULATORY_CARE_PROVIDER_SITE_OTHER): Payer: Medicare Other | Admitting: Internal Medicine

## 2021-04-06 ENCOUNTER — Encounter: Payer: Self-pay | Admitting: Internal Medicine

## 2021-04-06 ENCOUNTER — Ambulatory Visit (INDEPENDENT_AMBULATORY_CARE_PROVIDER_SITE_OTHER): Payer: Medicare Other

## 2021-04-06 ENCOUNTER — Other Ambulatory Visit: Payer: Self-pay

## 2021-04-06 VITALS — BP 134/84 | HR 62 | Temp 98.4°F | Resp 18 | Ht 64.0 in | Wt 203.2 lb

## 2021-04-06 DIAGNOSIS — G8929 Other chronic pain: Secondary | ICD-10-CM

## 2021-04-06 DIAGNOSIS — I420 Dilated cardiomyopathy: Secondary | ICD-10-CM

## 2021-04-06 DIAGNOSIS — Z6834 Body mass index (BMI) 34.0-34.9, adult: Secondary | ICD-10-CM

## 2021-04-06 DIAGNOSIS — I10 Essential (primary) hypertension: Secondary | ICD-10-CM

## 2021-04-06 DIAGNOSIS — Z833 Family history of diabetes mellitus: Secondary | ICD-10-CM | POA: Diagnosis not present

## 2021-04-06 DIAGNOSIS — M25561 Pain in right knee: Secondary | ICD-10-CM

## 2021-04-06 DIAGNOSIS — K529 Noninfective gastroenteritis and colitis, unspecified: Secondary | ICD-10-CM

## 2021-04-06 DIAGNOSIS — E6609 Other obesity due to excess calories: Secondary | ICD-10-CM

## 2021-04-06 DIAGNOSIS — M1711 Unilateral primary osteoarthritis, right knee: Secondary | ICD-10-CM | POA: Diagnosis not present

## 2021-04-06 DIAGNOSIS — E782 Mixed hyperlipidemia: Secondary | ICD-10-CM

## 2021-04-06 DIAGNOSIS — R748 Abnormal levels of other serum enzymes: Secondary | ICD-10-CM

## 2021-04-06 LAB — COMPREHENSIVE METABOLIC PANEL
ALT: 71 U/L — ABNORMAL HIGH (ref 0–35)
AST: 69 U/L — ABNORMAL HIGH (ref 0–37)
Albumin: 4.1 g/dL (ref 3.5–5.2)
Alkaline Phosphatase: 97 U/L (ref 39–117)
BUN: 13 mg/dL (ref 6–23)
CO2: 27 mEq/L (ref 19–32)
Calcium: 9.5 mg/dL (ref 8.4–10.5)
Chloride: 102 mEq/L (ref 96–112)
Creatinine, Ser: 0.55 mg/dL (ref 0.40–1.20)
GFR: 89.38 mL/min (ref >60.00)
Glucose, Bld: 109 mg/dL — ABNORMAL HIGH (ref 70–99)
Potassium: 4 mEq/L (ref 3.5–5.1)
Sodium: 138 mEq/L (ref 135–145)
Total Bilirubin: 0.4 mg/dL (ref 0.2–1.2)
Total Protein: 7.4 g/dL (ref 6.0–8.3)

## 2021-04-06 LAB — CBC
HCT: 43.6 % (ref 36.0–46.0)
Hemoglobin: 14.9 g/dL (ref 12.0–15.0)
MCHC: 34.2 g/dL (ref 30.0–36.0)
MCV: 93.2 fl (ref 78.0–100.0)
Platelets: 134 10*3/uL — ABNORMAL LOW (ref 150.0–400.0)
RBC: 4.68 Mil/uL (ref 3.87–5.11)
RDW: 14.1 % (ref 11.5–15.5)
WBC: 6.6 10*3/uL (ref 4.0–10.5)

## 2021-04-06 LAB — LIPID PANEL
Cholesterol: 172 mg/dL (ref 0–200)
HDL: 44.4 mg/dL (ref >39.00)
LDL Cholesterol: 99 mg/dL (ref 0–99)
NonHDL: 127.12
Total CHOL/HDL Ratio: 4
Triglycerides: 142 mg/dL (ref 0.0–149.0)
VLDL: 28.4 mg/dL (ref 0.0–40.0)

## 2021-04-06 LAB — HEMOGLOBIN A1C: Hgb A1c MFr Bld: 5.6 % (ref 4.6–6.5)

## 2021-04-06 NOTE — Progress Notes (Signed)
   Subjective:   Patient ID: Madison Gutierrez, female    DOB: 10-18-1945, 75 y.o.   MRN: [id number]  HPI The patient is a new 75 YO female returning to the practice. Last seen 2019. She has had some health concerns in the meantime and seen cardiology. She has upcoming visit with rheumatology arranged by cardiology. She is having joint pains for awhile. She is unable to exercise due to the pain. This is impacting her weight in a negative fashion. She is needing some follow up on chronic health conditions and preventative care.  PMH, [facility], social history reviewed and updated  Review of Systems  Constitutional:  Positive for activity change and unexpected weight change.  HENT: Negative.    Eyes: Negative.   Respiratory:  Negative for cough, chest tightness and shortness of breath.   Cardiovascular:  Negative for chest pain, palpitations and leg swelling.  Gastrointestinal:  Negative for abdominal distention, abdominal pain, constipation, diarrhea, nausea and vomiting.  Musculoskeletal:  Positive for arthralgias and myalgias.  Skin: Negative.   Neurological: Negative.   Psychiatric/Behavioral: Negative.     Objective:  Physical Exam Constitutional:      Appearance: She is well-developed. She is obese.  HENT:     Head: Normocephalic and atraumatic.  Cardiovascular:     Rate and Rhythm: Normal rate and regular rhythm.  Pulmonary:     Effort: Pulmonary effort is normal. No respiratory distress.     Breath sounds: Normal breath sounds. No wheezing or rales.  Abdominal:     General: Bowel sounds are normal. There is no distension.     Palpations: Abdomen is soft.     Tenderness: There is no abdominal tenderness. There is no rebound.  Musculoskeletal:        General: Tenderness present.     Cervical back: Normal range of motion.  Skin:    General: Skin is warm and dry.  Neurological:     Mental Status: She is alert and oriented to person, place, and time.     Coordination: Coordination  normal.    Vitals:   04/06/21 1324  BP: 134/84  Pulse: 62  Resp: 18  Temp: 98.4 F (36.9 C)  TempSrc: Oral  SpO2: 95%  Weight: 203 lb 3.2 oz (92.2 kg)  Height: 5\' 4"  (1.626 m)    This visit occurred during the SARS-CoV-2 public health emergency.  Safety protocols were in place, including screening questions prior to the visit, additional usage of staff PPE, and extensive cleaning of exam room while observing appropriate contact time as indicated for disinfecting solutions.   Assessment & Plan:

## 2021-04-06 NOTE — Patient Instructions (Addendum)
We will check the labs today.   We could try jardiance for the weight and for the heart failure.   Other options for weight loss include plenity, contrave are pill options, saxenda or wegovy are injections to help with weight.

## 2021-04-09 DIAGNOSIS — Z833 Family history of diabetes mellitus: Secondary | ICD-10-CM | POA: Insufficient documentation

## 2021-04-09 DIAGNOSIS — G8929 Other chronic pain: Secondary | ICD-10-CM | POA: Insufficient documentation

## 2021-04-09 DIAGNOSIS — E785 Hyperlipidemia, unspecified: Secondary | ICD-10-CM | POA: Insufficient documentation

## 2021-04-09 NOTE — Assessment & Plan Note (Signed)
Checking HgA1c, recent weight gain due to immobility. Counseled about diet modifications.

## 2021-04-09 NOTE — Assessment & Plan Note (Signed)
Recent weight gain due to lack of exercise due to joint concerns.

## 2021-04-09 NOTE — Assessment & Plan Note (Signed)
Checking CMP for stability. She has seen GI remotely.

## 2021-04-09 NOTE — Assessment & Plan Note (Signed)
BP at goal on lisinopril and spironolactone. Checking CMP and adjust as needed.

## 2021-04-09 NOTE — Assessment & Plan Note (Signed)
Has not been taking crestor 10 mg daily.

## 2021-04-09 NOTE — Assessment & Plan Note (Signed)
Seeing cardiology and they are monitoring cardiac function and fluid status.

## 2021-04-09 NOTE — Assessment & Plan Note (Signed)
Checking x-ray right knee. Depending on results decide on most appropriate treatment.

## 2021-04-09 NOTE — Assessment & Plan Note (Signed)
Overall stable and chronic.

## 2021-04-27 NOTE — Telephone Encounter (Signed)
Leaned over and looked up underneath to look at the switches and that is when her knees were weak.  This occurred at 7:00 am. She felt the same feeling as earlier when she was asleep--  One hr before that she dreamed she passed out and woke up with a start. Her heart was pounding.   Got up about 20 min later and felt fine.   ___________________________________  This was different than anything before.  "The blood was leaving my body and chest got tight." Was trying not to fall.    Feels not quite like herself today. Kind of exhausted.  Scared it might happen again.  Prior to all this she has felt normal, great.  Have been having issues w low bp over the last weeks.  122/75, 7/14. On 7/16 117/71,  before am meds . Had not taken meds yet today. Normally her readings are 139/83.   Feels she is drinking enough fluids. Drinks water.   Reviewed w DOD who feels pt should contact primary care provider for symptoms and keep regular routine follow up for cardiology.  Let pt know via mychart to monitor symptoms and blood pressures as we had discussed earlier.

## 2021-11-19 ENCOUNTER — Other Ambulatory Visit: Payer: Self-pay

## 2021-11-19 ENCOUNTER — Ambulatory Visit (HOSPITAL_COMMUNITY): Payer: Medicare Other | Attending: Cardiology

## 2021-11-19 DIAGNOSIS — I10 Essential (primary) hypertension: Secondary | ICD-10-CM | POA: Diagnosis not present

## 2021-11-19 DIAGNOSIS — I471 Supraventricular tachycardia, unspecified: Secondary | ICD-10-CM

## 2021-11-19 DIAGNOSIS — I428 Other cardiomyopathies: Secondary | ICD-10-CM

## 2021-11-19 DIAGNOSIS — I2510 Atherosclerotic heart disease of native coronary artery without angina pectoris: Secondary | ICD-10-CM | POA: Diagnosis not present

## 2021-11-19 DIAGNOSIS — I350 Nonrheumatic aortic (valve) stenosis: Secondary | ICD-10-CM

## 2021-11-19 LAB — ECHOCARDIOGRAM COMPLETE
AR max vel: 1.59 cm2
AV Area VTI: 1.34 cm2
AV Area mean vel: 1.69 cm2
AV Mean grad: 16 mmHg
AV Peak grad: 26.8 mmHg
Ao pk vel: 2.59 m/s
Area-P 1/2: 5.34 cm2
P 1/2 time: 547 msec
S' Lateral: 2.6 cm

## 2022-02-11 ENCOUNTER — Encounter: Payer: Self-pay | Admitting: Cardiovascular Disease

## 2022-02-11 NOTE — Telephone Encounter (Signed)
Spoke with pt and would like to restart Entresto Pt due for yearly f/u. Appt made for tomorrow with Dr Angelena Form Will forward to Dr Angelena Form for review ./cy ?

## 2022-02-12 ENCOUNTER — Encounter: Payer: Self-pay | Admitting: Cardiovascular Disease

## 2022-02-12 ENCOUNTER — Ambulatory Visit: Payer: Medicare Other | Admitting: Cardiovascular Disease

## 2022-02-12 VITALS — BP 118/62 | HR 84 | Ht 64.0 in | Wt 201.2 lb

## 2022-02-12 DIAGNOSIS — I350 Nonrheumatic aortic (valve) stenosis: Secondary | ICD-10-CM | POA: Diagnosis not present

## 2022-02-12 DIAGNOSIS — I2510 Atherosclerotic heart disease of native coronary artery without angina pectoris: Secondary | ICD-10-CM

## 2022-02-12 DIAGNOSIS — I10 Essential (primary) hypertension: Secondary | ICD-10-CM | POA: Diagnosis not present

## 2022-02-12 DIAGNOSIS — I428 Other cardiomyopathies: Secondary | ICD-10-CM

## 2022-02-12 DIAGNOSIS — I471 Supraventricular tachycardia: Secondary | ICD-10-CM

## 2022-02-12 MED ORDER — ENTRESTO 24-26 MG PO TABS: 1.0000 | ORAL_TABLET | Freq: Two times a day (BID) | ORAL | 3 refills | Status: DC

## 2022-02-12 MED ORDER — ENTRESTO 24-26 MG PO TABS: 1.0000 | ORAL_TABLET | Freq: Two times a day (BID) | ORAL | 11 refills | Status: DC

## 2022-02-12 NOTE — Patient Instructions (Signed)
Medication Instructions:  ?Your physician has recommended you make the following change in your medication:  ?1.) stop lisinopirl ?2.) start Entresto 24-26 mg ? ?*If you need a refill on your cardiac medications before your next appointment, please call your pharmacy* ? ? ?Lab Work: ?Please return in 7-10 days for blood work Artist) ? ?If you have labs (blood work) drawn today and your tests are completely normal, you will receive your results only by: ?MyChart Message (if you have MyChart) OR ?A paper copy in the mail ?If you have any lab test that is abnormal or we need to change your treatment, we will call you to review the results. ? ? ?Testing/Procedures: ?none ? ? ?Follow-Up: ?At [facility], you and your health needs are our priority.  As part of our continuing mission to provide you with exceptional heart care, we have created designated Provider Care Teams.  These Care Teams include your primary Cardiologist (physician) and Advanced Practice Providers (APPs -  Physician Assistants and Nurse Practitioners) who all work together to provide you with the care you need, when you need it. ? ? ?Your next appointment:   ?12 month(s) ? ?The format for your next appointment:   ?In Person ? ?Provider:   ?Lauree Chandler, MD   ? ? ?Other Instructions ? ? ?Important Information About Sugar ? ? ? ? ?  ?

## 2022-02-12 NOTE — Progress Notes (Signed)
? ? ?Chief Complaint  ?Patient presents with  ? Follow-up  ?  CAD, aortic stenosis  ? ?History of Present Illness: 76 yo female with history of HTN, aortic stenosis, chronic LBBB, mild mitral regurgitation, mild CAD, non-ischemic cardiomyopathy and SVT here today for cardiac follow up. I saw her as a new patient August 2012 for evaluation of palpitations. Her EKG in primary care showed PVCs. I arranged an echo and a 48 hour Holter monitor August 2012. Her echo showed normal LV size and function with EF of 70%. There was mild aortic valve disease with mild stenosis and regurgitation. Her Holter showed normal sinus rhythm at baseline with frequent PVCs and several runs of SVT (longest 104 beats at 177 beats per minute). She was started on Cardizem. Echo March 2018 with LVEF=60-65%, aortic valve sclerosis with mild aortic stenosis.  I saw her in the office 11/13/18 and she c/o chest pressure and dyspnea with exertion. Echo 11/24/18 with LVEF=35-40% with severe hypokinesis of the inferoseptal and anteroseptal walls. Image quality was poor. The aortic leaflets are thickened with restricted opening mean gradient of 14 mmHg c/w mild aortic stenosis. Mild MR. Nuclear stress test 11/24/18 without any large areas of ischemia.  Cardiac cath 12/29/18 showed mild non-obstructive CAD and mild aortic stenosis with normal LV systolic function. LVEDP was normal. She was seen by Melina Copa, PA 11/14/19 and reported worsening dyspnea on exertion in a gradual continuum from the year prior. Her blood pressure was not well controlled. She also noted frequent, daily chest "aching" unchanged ever since before her cath, not anginal or exertional in nature. She also reported episodic wheezing. She had gained weight but the patient strongly felt this was body weight gain rather than fluid weight gain. Echo 11/26/19 showed EF 40% with global hypokinesis with septal-lateral dyssynchrony consistent with LBBB, grade 1 DD, mild MR, mild dilation of  aortic root. We attempted to change low dose lisinopril to Entresto. She did not tolerate Entresto. Aldactone was added. She increased her Lisinopril dose and stopped Toprol and her dyspnea improved. Cardiac MRI April 2021 with LVEF=42% with global hypokinesis with asymmetric septal hypertrophy with no SAM or LVOT obstruction. Suggestive of hypertensive heart disease. No findings to suggest an infiltrative process. Echo February 2023 with LVEF=45-50%, mild MR, mild AS (mean gradient 16 mmHg).  ? ?She is here today for follow up. The patient denies any chest pain, dyspnea, palpitations, lower extremity edema, orthopnea, PND, dizziness, near syncope or syncope. Her BP has been elevated at home.  ?  ?Primary Care Physician:  Hoyt Koch, MD ? ?Past Medical History:  ?Diagnosis Date  ? Abnormal liver function test   ? Asthma   ? mild asthma from dairy allergies  ? Elevated LFTs 05/19/11  ? Frequent PVCs   ? GERD (gastroesophageal reflux disease)   ? Hepatic steatosis 05/24/11  ? Hepatitis, unspecified   ? Hypertension   ? Mild aortic stenosis   ? Mild CAD   ? a. by cath 12/2018.  ? Obesity   ? Osteopenia 05/2011  ? t score -1.9  ? Psoriasis   ? Rheumatoid arthritis(714.0)   ? Staph aureus infection 2007  ? rt. shin  ? SVT (supraventricular tachycardia) ([place])   ? Ulcer   ? ? ?Past Surgical History:  ?Procedure Laterality Date  ? APPENDECTOMY  1998  ? FACIAL RECONSTRUCTION SURGERY    ? HERNIA REPAIR  2003  ? bi-lat  repair   ? OVARIAN CYST REMOVAL  1978  ?  from left ovary   ? RIGHT/LEFT HEART CATH AND CORONARY ANGIOGRAPHY N/A 12/29/2018  ? Procedure: RIGHT/LEFT HEART CATH AND CORONARY ANGIOGRAPHY;  Surgeon: Burnell Blanks, MD;  Location: [facility];  Service: Cardiovascular;  Laterality: N/A;  ? tummy tuck  2003  ? ? ?Current Outpatient Medications  ?Medication Sig Dispense Refill  ? Calcium Carb-Cholecalciferol (CALCIUM 1000 + D PO) Take 1 tablet by mouth in the morning and at bedtime.    ?  Cholecalciferol (VITAMIN D) 125 MCG (5000 UT) CAPS Take by mouth daily.    ? CVS CINNAMON PO Take 6,000 mg by mouth in the morning and at bedtime.    ? Cyanocobalamin (VITAMIN B12 PO) Take 1 tablet by mouth daily.    ? MILK THISTLE PO Take 1 capsule by mouth in the morning and at bedtime.    ? Multiple Vitamin (MULTIVITAMIN WITH MINERALS) TABS tablet Take 1 tablet by mouth daily.    ? spironolactone (ALDACTONE) 25 MG tablet Take 1/2 (one-half) tablet by mouth once daily 45 tablet 3  ? furosemide (LASIX) 20 MG tablet Take 1 tablet (20 mg total) by mouth daily as needed. 60 tablet 3  ? sacubitril-valsartan (ENTRESTO) 24-26 MG Take 1 tablet by mouth 2 (two) times daily. 180 tablet 3  ? ?No current facility-administered medications for this visit.  ? ? ?Allergies  ?Allergen Reactions  ? Dairy Aid [Tilactase]   ? Entresto [Sacubitril-Valsartan]   ?  Weak, shaky, dizzy  ? Petrolatum-Zinc Oxide Rash  ? Sulfa Antibiotics Rash  ? Sulfamethoxazole Rash  ? ? ?Social History  ? ?Socioeconomic History  ? Marital status: Married  ?  Spouse name: Not on file  ? Number of children: 3  ? Years of education: Not on file  ? Highest education level: Not on file  ?Occupational History  ? Occupation: retired  ?  Employer: RETIRED  ?Tobacco Use  ? Smoking status: Never  ? Smokeless tobacco: Never  ?Vaping Use  ? Vaping Use: Never used  ?Substance and Sexual Activity  ? Alcohol use: Yes  ?  Alcohol/week: 14.0 standard drinks  ?  Types: 14 Glasses of wine per week  ?  Comment: at least two glasses of wine sometimes more nightly  ? Drug use: No  ? Sexual activity: Not Currently  ?  Partners: Male  ?  Birth control/protection: Post-menopausal  ?Other Topics Concern  ? Not on file  ?Social History Narrative  ? Not on file  ? ?Social Determinants of Health  ? ?Financial Resource Strain: Not on file  ?Food Insecurity: Not on file  ?Transportation Needs: Not on file  ?Physical Activity: Not on file  ?Stress: Not on file  ?Social Connections: Not  on file  ?Intimate Partner Violence: Not on file  ? ? ?Family History  ?Problem Relation Age of Onset  ? Hypertension Father   ? Heart disease Father   ? Heart attack Father   ? Hypertension Mother   ? Heart disease Mother   ? Heart attack Mother   ? Hypertension Brother   ? Prostate cancer Brother   ? Hypertension Maternal Grandmother   ? Colon cancer Neg Hx   ? ? ?Review of Systems:  As stated in the HPI and otherwise negative.  ? ?BP 118/62   Pulse 84   Ht '5\' 4"'$  (1.626 m)   Wt 201 lb 3.2 oz (91.3 kg)   SpO2 97%   BMI 34.54 kg/m?  ? ?Physical Examination: ?General: Well  developed, well nourished, NAD  ?HEENT: OP clear, mucus membranes moist  ?SKIN: warm, dry. No rashes. ?Neuro: No focal deficits  ?Musculoskeletal: Muscle strength 5/5 all ext  ?Psychiatric: Mood and affect normal  ?Neck: No JVD, no carotid bruits, no thyromegaly, no lymphadenopathy.  ?Lungs:Clear bilaterally, no wheezes, rhonci, crackles ?Cardiovascular: Regular rate and rhythm. Systolic murmur ?Abdomen:Soft. Bowel sounds present. Non-tender.  ?Extremities: No lower extremity edema. Pulses are 2 + in the bilateral DP/PT. ? ?Echo 11/19/21: ? ? 1. Left ventricular ejection fraction, by estimation, is 45 to 50%. Left  ?ventricular ejection fraction by 3D volume is 48 %. The left ventricle has  ?mildly decreased function. The left ventricle demonstrates global  ?hypokinesis. Left ventricular diastolic  ? parameters are consistent with Grade I diastolic dysfunction (impaired  ?relaxation). Elevated left ventricular end-diastolic pressure.  ? 2. Right ventricular systolic function is normal. The right ventricular  ?size is normal. Tricuspid regurgitation signal is inadequate for assessing  ?PA pressure.  ? 3. The mitral valve is normal in structure. Mild mitral valve  ?regurgitation. No evidence of mitral stenosis.  ? 4. The aortic valve was not well visualized. Aortic valve regurgitation  ?is trivial. Mild aortic valve stenosis. Aortic regurgitation  PHT measures  ?547 msec. Aortic valve area, by VTI measures 1.34 cm?Marland Kitchen Aortic valve mean  ?gradient measures 16.0 mmHg.  ?Aortic valve Vmax measures 2.59 m/s.  ? 5. Aortic dilatation noted. There is borderline dilatation

## 2022-02-15 ENCOUNTER — Encounter: Payer: Self-pay | Admitting: Cardiovascular Disease

## 2022-02-15 NOTE — Telephone Encounter (Signed)
Confirmed w [facility] that they have the prescription for 90 day supply, but that since the patient picked up the 30 day supply, she will have to wait to get the next prescription as a 90 day supply because insurance will not pay for a 90 day supply right now, or even 60 more days.  I called the patient to let her know.  She will plan to get the 90 day supply at the end of May.  Pt appreciative for assistance. ?

## 2022-02-19 ENCOUNTER — Other Ambulatory Visit: Payer: Medicare Other | Admitting: *Deleted

## 2022-02-19 DIAGNOSIS — I10 Essential (primary) hypertension: Secondary | ICD-10-CM

## 2022-02-19 DIAGNOSIS — I350 Nonrheumatic aortic (valve) stenosis: Secondary | ICD-10-CM | POA: Diagnosis not present

## 2022-02-19 DIAGNOSIS — I2510 Atherosclerotic heart disease of native coronary artery without angina pectoris: Secondary | ICD-10-CM | POA: Diagnosis not present

## 2022-02-19 DIAGNOSIS — I471 Supraventricular tachycardia: Secondary | ICD-10-CM

## 2022-02-19 DIAGNOSIS — I428 Other cardiomyopathies: Secondary | ICD-10-CM

## 2022-02-20 LAB — BASIC METABOLIC PANEL
BUN/Creatinine Ratio: 28 (ref 12–28)
BUN: 17 mg/dL (ref 8–27)
CO2: 25 mmol/L (ref 20–29)
Calcium: 9.8 mg/dL (ref 8.7–10.3)
Chloride: 101 mmol/L (ref 96–106)
Creatinine, Ser: 0.6 mg/dL (ref 0.57–1.00)
Glucose: 92 mg/dL (ref 70–99)
Potassium: 4.5 mmol/L (ref 3.5–5.2)
Sodium: 142 mmol/L (ref 134–144)
eGFR: 93 mL/min/{1.73_m2} (ref >59)

## 2022-03-07 ENCOUNTER — Other Ambulatory Visit: Payer: Self-pay | Admitting: Cardiovascular Disease

## 2022-03-09 ENCOUNTER — Other Ambulatory Visit: Payer: Self-pay

## 2022-03-09 MED ORDER — SPIRONOLACTONE 25 MG PO TABS: ORAL_TABLET | ORAL | 3 refills | Status: DC

## 2022-11-09 DIAGNOSIS — H43812 Vitreous degeneration, left eye: Secondary | ICD-10-CM | POA: Diagnosis not present

## 2022-11-09 DIAGNOSIS — H25813 Combined forms of age-related cataract, bilateral: Secondary | ICD-10-CM | POA: Diagnosis not present

## 2022-11-09 DIAGNOSIS — H35372 Puckering of macula, left eye: Secondary | ICD-10-CM | POA: Diagnosis not present

## 2022-11-09 DIAGNOSIS — H0102A Squamous blepharitis right eye, upper and lower eyelids: Secondary | ICD-10-CM | POA: Diagnosis not present

## 2022-11-09 DIAGNOSIS — H0102B Squamous blepharitis left eye, upper and lower eyelids: Secondary | ICD-10-CM | POA: Diagnosis not present

## 2022-11-12 DIAGNOSIS — H524 Presbyopia: Secondary | ICD-10-CM | POA: Diagnosis not present

## 2022-12-17 ENCOUNTER — Ambulatory Visit (INDEPENDENT_AMBULATORY_CARE_PROVIDER_SITE_OTHER): Payer: Medicare Other | Admitting: Internal Medicine

## 2022-12-17 ENCOUNTER — Encounter: Payer: Self-pay | Admitting: Internal Medicine

## 2022-12-17 VITALS — BP 124/78 | HR 80 | Temp 98.1°F | Ht 64.0 in | Wt 199.2 lb

## 2022-12-17 DIAGNOSIS — R748 Abnormal levels of other serum enzymes: Secondary | ICD-10-CM

## 2022-12-17 DIAGNOSIS — M545 Low back pain, unspecified: Secondary | ICD-10-CM | POA: Diagnosis not present

## 2022-12-17 DIAGNOSIS — Z833 Family history of diabetes mellitus: Secondary | ICD-10-CM

## 2022-12-17 DIAGNOSIS — G8929 Other chronic pain: Secondary | ICD-10-CM

## 2022-12-17 DIAGNOSIS — I10 Essential (primary) hypertension: Secondary | ICD-10-CM

## 2022-12-17 DIAGNOSIS — E782 Mixed hyperlipidemia: Secondary | ICD-10-CM

## 2022-12-17 DIAGNOSIS — Z0000 Encounter for general adult medical examination without abnormal findings: Secondary | ICD-10-CM

## 2022-12-17 DIAGNOSIS — R21 Rash and other nonspecific skin eruption: Secondary | ICD-10-CM

## 2022-12-17 DIAGNOSIS — I420 Dilated cardiomyopathy: Secondary | ICD-10-CM

## 2022-12-17 LAB — LIPID PANEL
Cholesterol: 155 mg/dL (ref 0–200)
HDL: 50.5 mg/dL (ref >39.00)
LDL Cholesterol: 84 mg/dL (ref 0–99)
NonHDL: 104.45
Total CHOL/HDL Ratio: 3
Triglycerides: 100 mg/dL (ref 0.0–149.0)
VLDL: 20 mg/dL (ref 0.0–40.0)

## 2022-12-17 LAB — COMPREHENSIVE METABOLIC PANEL
ALT: 82 U/L — ABNORMAL HIGH (ref 0–35)
AST: 103 U/L — ABNORMAL HIGH (ref 0–37)
Albumin: 4 g/dL (ref 3.5–5.2)
Alkaline Phosphatase: 95 U/L (ref 39–117)
BUN: 13 mg/dL (ref 6–23)
CO2: 30 mEq/L (ref 19–32)
Calcium: 9.9 mg/dL (ref 8.4–10.5)
Chloride: 103 mEq/L (ref 96–112)
Creatinine, Ser: 0.55 mg/dL (ref 0.40–1.20)
GFR: 88.32 mL/min (ref >60.00)
Glucose, Bld: 88 mg/dL (ref 70–99)
Potassium: 4.4 mEq/L (ref 3.5–5.1)
Sodium: 141 mEq/L (ref 135–145)
Total Bilirubin: 0.6 mg/dL (ref 0.2–1.2)
Total Protein: 7.3 g/dL (ref 6.0–8.3)

## 2022-12-17 LAB — CBC
HCT: 47.2 % — ABNORMAL HIGH (ref 36.0–46.0)
Hemoglobin: 16 g/dL — ABNORMAL HIGH (ref 12.0–15.0)
MCHC: 34 g/dL (ref 30.0–36.0)
MCV: 95.2 fl (ref 78.0–100.0)
Platelets: 119 10*3/uL — ABNORMAL LOW (ref 150.0–400.0)
RBC: 4.95 Mil/uL (ref 3.87–5.11)
RDW: 13.9 % (ref 11.5–15.5)
WBC: 6 10*3/uL (ref 4.0–10.5)

## 2022-12-17 LAB — HEMOGLOBIN A1C: Hgb A1c MFr Bld: 5.4 % (ref 4.6–6.5)

## 2022-12-17 MED ORDER — DOXYCYCLINE HYCLATE 100 MG PO TABS: 100.0000 mg | ORAL_TABLET | Freq: Two times a day (BID) | ORAL | 0 refills | Status: AC

## 2022-12-17 MED ORDER — LISINOPRIL 20 MG PO TABS: 20.0000 mg | ORAL_TABLET | Freq: Every day | ORAL | 3 refills | Status: DC

## 2022-12-17 NOTE — Assessment & Plan Note (Signed)
No recent follow up checking CMP today.

## 2022-12-17 NOTE — Patient Instructions (Addendum)
We are checking the labs today.  

## 2022-12-17 NOTE — Assessment & Plan Note (Signed)
Checking lipid panel and adjust as needed. Not on statin currently.  °

## 2022-12-17 NOTE — Assessment & Plan Note (Signed)
Checking HgA1c. Weight stable from before and adjust as needed.

## 2022-12-17 NOTE — Assessment & Plan Note (Signed)
Overall progressive and she is using tylenol otc for pain. Encouraged to continue and be active.

## 2022-12-17 NOTE — Assessment & Plan Note (Signed)
Flu shot declines. Pneumonia declines. Shingrix declines. Tetanus declines. Colonoscopy overdue she is unsure she desires further screening. Mammogram aged out, pap smear aged out and dexa complete. Counseled about sun safety and mole surveillance. Counseled about the dangers of distracted driving. Given 10 year screening recommendations.

## 2022-12-17 NOTE — Progress Notes (Signed)
Subjective:   Patient ID: Madison Gutierrez, female    DOB: [birth year], 77 y.o.   MRN: [id number]  HPI Here for medicare wellness and physical, with new complaints last seen 2022. Please see A/P for status and treatment of chronic medical problems.   Diet: heart healthy Physical activity: sedentary Depression/mood screen: negative Hearing: moderate loss bilateral, no aids Visual acuity: grossly normal with lens, performs annual eye exam  ADLs: capable Fall risk: none Home safety: good Cognitive evaluation: intact to orientation, naming, recall and repetition EOL planning: adv directives discussed, in place  [employer] Visit from 12/17/2022 in [place] at [facility] Visit from 12/17/2022 in [place] at [facility]  PHQ-9 Total Score 6         10/07/2016   11:08 AM 04/06/2021    1:26 PM 12/17/2022   11:18 AM  [place] in the past year? No 0 0  Was there an injury with Fall?  0 0  Fall Risk Category Calculator  0 0  Fall Risk Category (Retired)  Low   Fall risk Follow up   Falls evaluation completed    I have personally reviewed and have noted 1. The patient's medical and social history - reviewed today no changes 2. Their use of alcohol, tobacco or illicit drugs 3. Their current medications and supplements 4. The patient's functional ability including ADL's, fall risks, home safety risks and hearing or visual impairment. 5. Diet and physical activities 6. Evidence for depression or mood disorders 7. Care team reviewed and updated 8.  The patient is not on an opioid pain medication  Patient Care Team: Hoyt Koch, MD as PCP - General (Internal Medicine) Burnell Blanks, MD as PCP - Cardiology (Cardiology) Past Medical History:  Diagnosis Date   Abnormal liver function test    Asthma    mild asthma from dairy allergies   Elevated LFTs 05/19/11    Frequent PVCs    GERD (gastroesophageal reflux disease)    Hepatic steatosis 05/24/11   Hepatitis, unspecified    Hypertension    Mild aortic stenosis    Mild CAD    a. by cath 12/2018.   Obesity    Osteopenia 05/2011   t score -1.9   Psoriasis    Rheumatoid arthritis(714.0)    Staph aureus infection 2007   rt. shin   SVT (supraventricular tachycardia)    Ulcer    Past Surgical History:  Procedure Laterality Date   APPENDECTOMY  1998   FACIAL RECONSTRUCTION SURGERY     HERNIA REPAIR  2003   bi-lat  repair    OVARIAN CYST REMOVAL  1978   from left ovary    RIGHT/LEFT HEART CATH AND CORONARY ANGIOGRAPHY N/A 12/29/2018   Procedure: RIGHT/LEFT HEART CATH AND CORONARY ANGIOGRAPHY;  Surgeon: Burnell Blanks, MD;  Location: [facility];  Service: Cardiovascular;  Laterality: N/A;   tummy tuck  2003   Family History  Problem Relation Age of Onset   Hypertension Father    Heart disease Father    Heart attack Father    Hypertension Mother    Heart disease Mother    Heart attack Mother    Hypertension Brother    Prostate cancer Brother    Hypertension Maternal Grandmother    Colon cancer Neg Hx    Review of Systems  Constitutional:  Negative.   HENT: Negative.    Eyes: Negative.   Respiratory:  Negative for cough, chest tightness and shortness of breath.   Cardiovascular:  Negative for chest pain, palpitations and leg swelling.  Gastrointestinal:  Negative for abdominal distention, abdominal pain, constipation, diarrhea, nausea and vomiting.  Musculoskeletal:  Positive for arthralgias, back pain and myalgias.  Skin:  Positive for rash.  Neurological: Negative.   Psychiatric/Behavioral: Negative.      Objective:  Physical Exam Constitutional:      Appearance: She is well-developed.  HENT:     Head: Normocephalic and atraumatic.  Cardiovascular:     Rate and Rhythm: Normal rate and regular rhythm.  Pulmonary:     Effort: Pulmonary effort is normal. No  respiratory distress.     Breath sounds: Normal breath sounds. No wheezing or rales.  Abdominal:     General: Bowel sounds are normal. There is no distension.     Palpations: Abdomen is soft.     Tenderness: There is no abdominal tenderness. There is no rebound.  Musculoskeletal:        General: Tenderness present.     Cervical back: Normal range of motion.  Skin:    General: Skin is warm and dry.     Findings: Rash present.  Neurological:     Mental Status: She is alert and oriented to person, place, and time.     Coordination: Coordination normal.     Vitals:   12/17/22 1114  BP: 124/78  Pulse: 80  Temp: 98.1 F (36.7 C)  TempSrc: Oral  SpO2: 94%  Weight: 199 lb 4 oz (90.4 kg)  Height: '5\' 4"'$  (1.626 m)    Assessment & Plan:

## 2022-12-17 NOTE — Assessment & Plan Note (Signed)
Consistent with acne on the face. Rx doxycycline 2 week course to clear.

## 2022-12-17 NOTE — Assessment & Plan Note (Signed)
She saw cardiology 02/2022 and they switched her to entresto. She has taken but was not feeling any different and this put her in doughnut hole. She does not want to do this. She asks to switch to lisinopril 20 mg daily and we will do this. Needs labs today so ordered CBC, CMP.

## 2022-12-17 NOTE — Assessment & Plan Note (Signed)
BP at goal. Will switch to lisinopril 20 mg daily due to cost stop entresto and keep spironolactone 12.5 mg daily. Adjust as needed. Checking CMP today.

## 2022-12-20 ENCOUNTER — Encounter: Payer: Self-pay | Admitting: Internal Medicine

## 2022-12-20 DIAGNOSIS — R748 Abnormal levels of other serum enzymes: Secondary | ICD-10-CM

## 2022-12-21 ENCOUNTER — Encounter: Payer: Self-pay | Admitting: Internal Medicine

## 2022-12-22 ENCOUNTER — Other Ambulatory Visit: Payer: Self-pay

## 2022-12-22 MED ORDER — LISINOPRIL 20 MG PO TABS: 20.0000 mg | ORAL_TABLET | Freq: Every day | ORAL | 3 refills | Status: DC

## 2022-12-24 ENCOUNTER — Telehealth: Payer: Self-pay | Admitting: Internal Medicine

## 2022-12-24 NOTE — Telephone Encounter (Signed)
[facility] Pharmacist called wanting to let Dr. Sharlet Salina know that the patient's Cardiologist took the patient off the medication  lisinopril (ZESTRIL) 20 MG tablet and put her back on the medication Entresto. The Pharmacist just wanted to make sure that Dr. Sharlet Salina was aware and if she has any questions to contact the patient.  Best callback number for the patient is [phone].

## 2023-03-22 ENCOUNTER — Other Ambulatory Visit (INDEPENDENT_AMBULATORY_CARE_PROVIDER_SITE_OTHER): Payer: Medicare Other

## 2023-03-22 ENCOUNTER — Ambulatory Visit: Payer: Medicare Other | Admitting: Internal Medicine

## 2023-03-22 DIAGNOSIS — R748 Abnormal levels of other serum enzymes: Secondary | ICD-10-CM | POA: Diagnosis not present

## 2023-03-22 LAB — COMPREHENSIVE METABOLIC PANEL
ALT: 79 U/L — ABNORMAL HIGH (ref 0–35)
AST: 95 U/L — ABNORMAL HIGH (ref 0–37)
Albumin: 4 g/dL (ref 3.5–5.2)
Alkaline Phosphatase: 103 U/L (ref 39–117)
BUN: 17 mg/dL (ref 6–23)
CO2: 28 mEq/L (ref 19–32)
Calcium: 9.6 mg/dL (ref 8.4–10.5)
Chloride: 102 mEq/L (ref 96–112)
Creatinine, Ser: 0.61 mg/dL (ref 0.40–1.20)
GFR: 85.98 mL/min (ref >60.00)
Glucose, Bld: 91 mg/dL (ref 70–99)
Potassium: 4.4 mEq/L (ref 3.5–5.1)
Sodium: 136 mEq/L (ref 135–145)
Total Bilirubin: 0.7 mg/dL (ref 0.2–1.2)
Total Protein: 7.6 g/dL (ref 6.0–8.3)

## 2023-03-24 ENCOUNTER — Other Ambulatory Visit: Payer: Self-pay | Admitting: Internal Medicine

## 2023-03-24 DIAGNOSIS — R748 Abnormal levels of other serum enzymes: Secondary | ICD-10-CM

## 2023-03-28 IMAGING — DX DG KNEE COMPLETE 4+V*R*
4 series · 4 of 4 positions shown · non-contrast
Comparison: None.

CLINICAL DATA: Knee pain x3 months

EXAM:
RIGHT KNEE - COMPLETE 4+ VIEW

[knee ap]
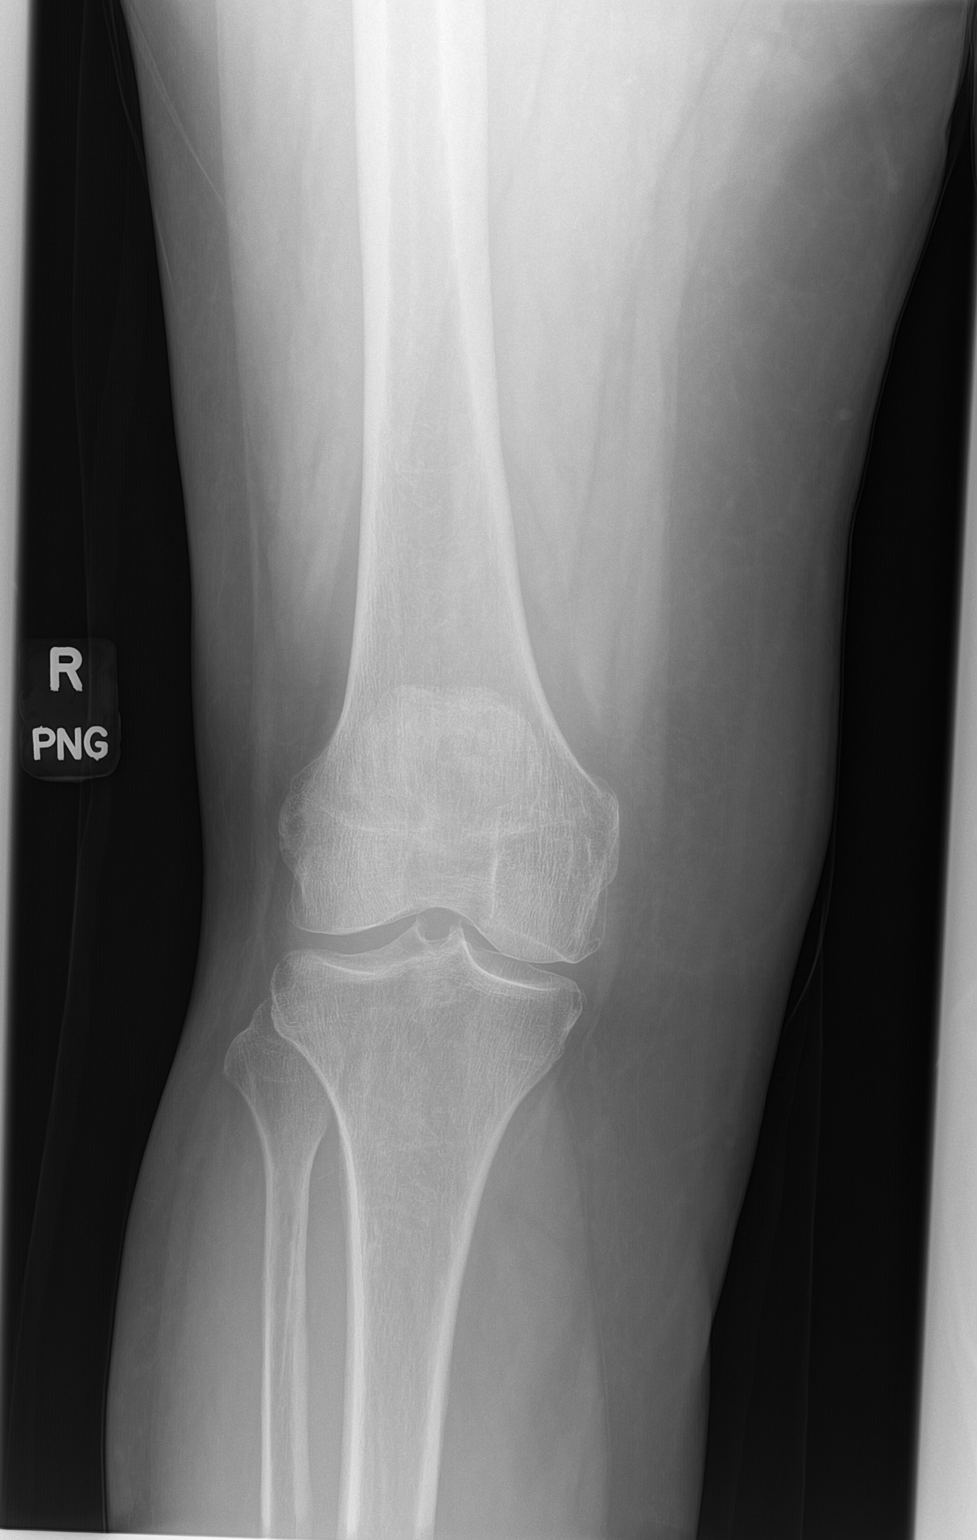

[knee obl (1 of 2)]
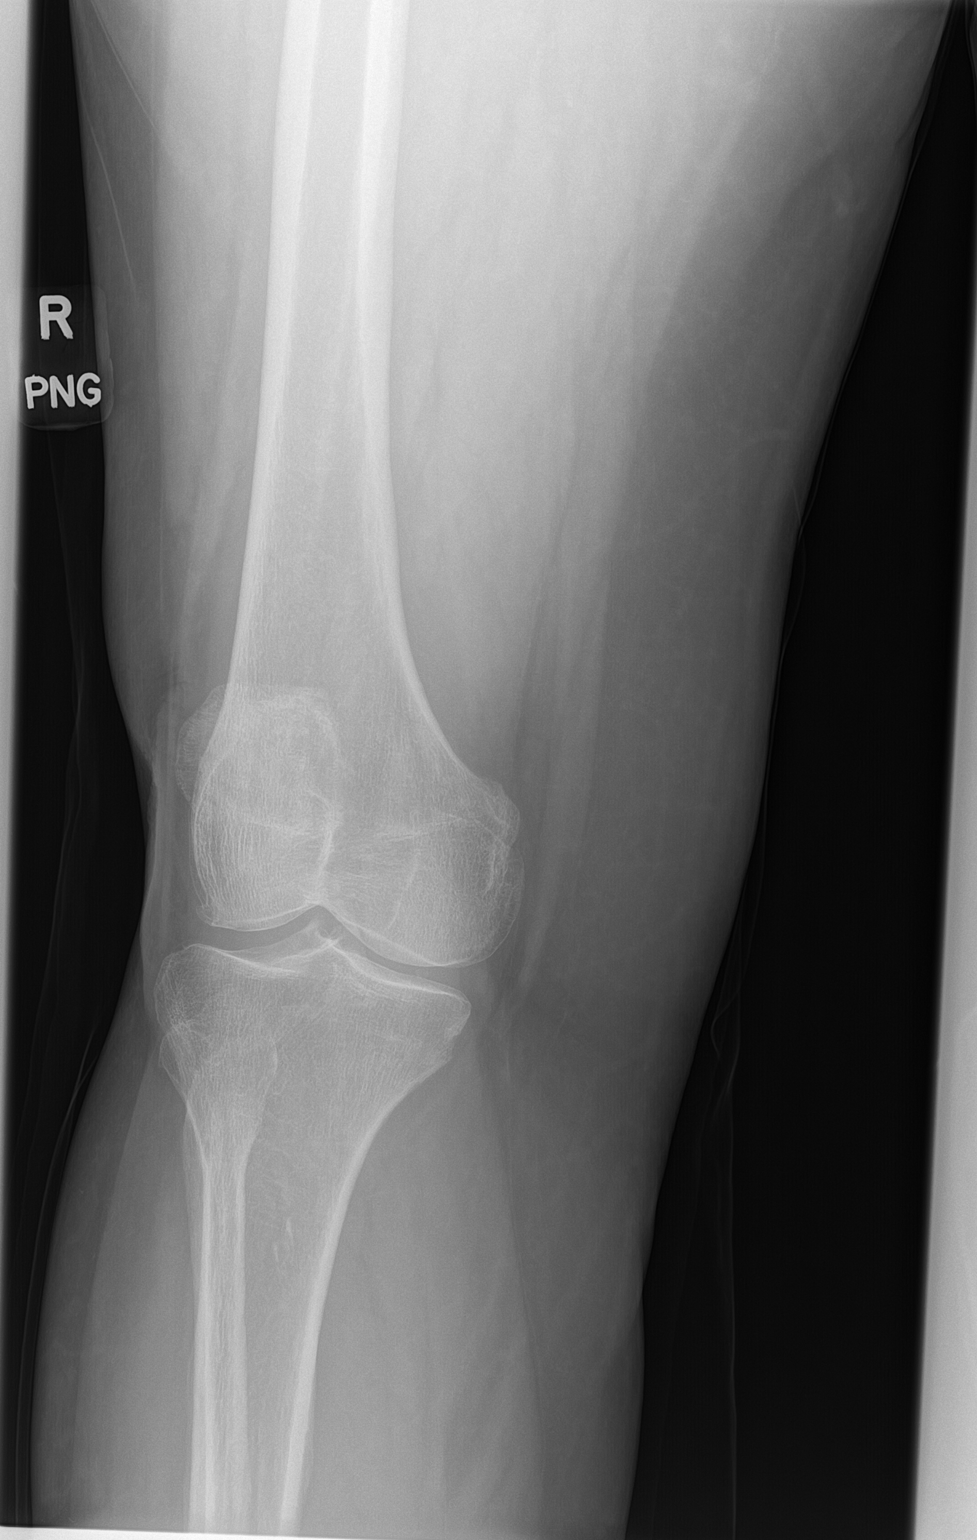

[knee obl (2 of 2)]
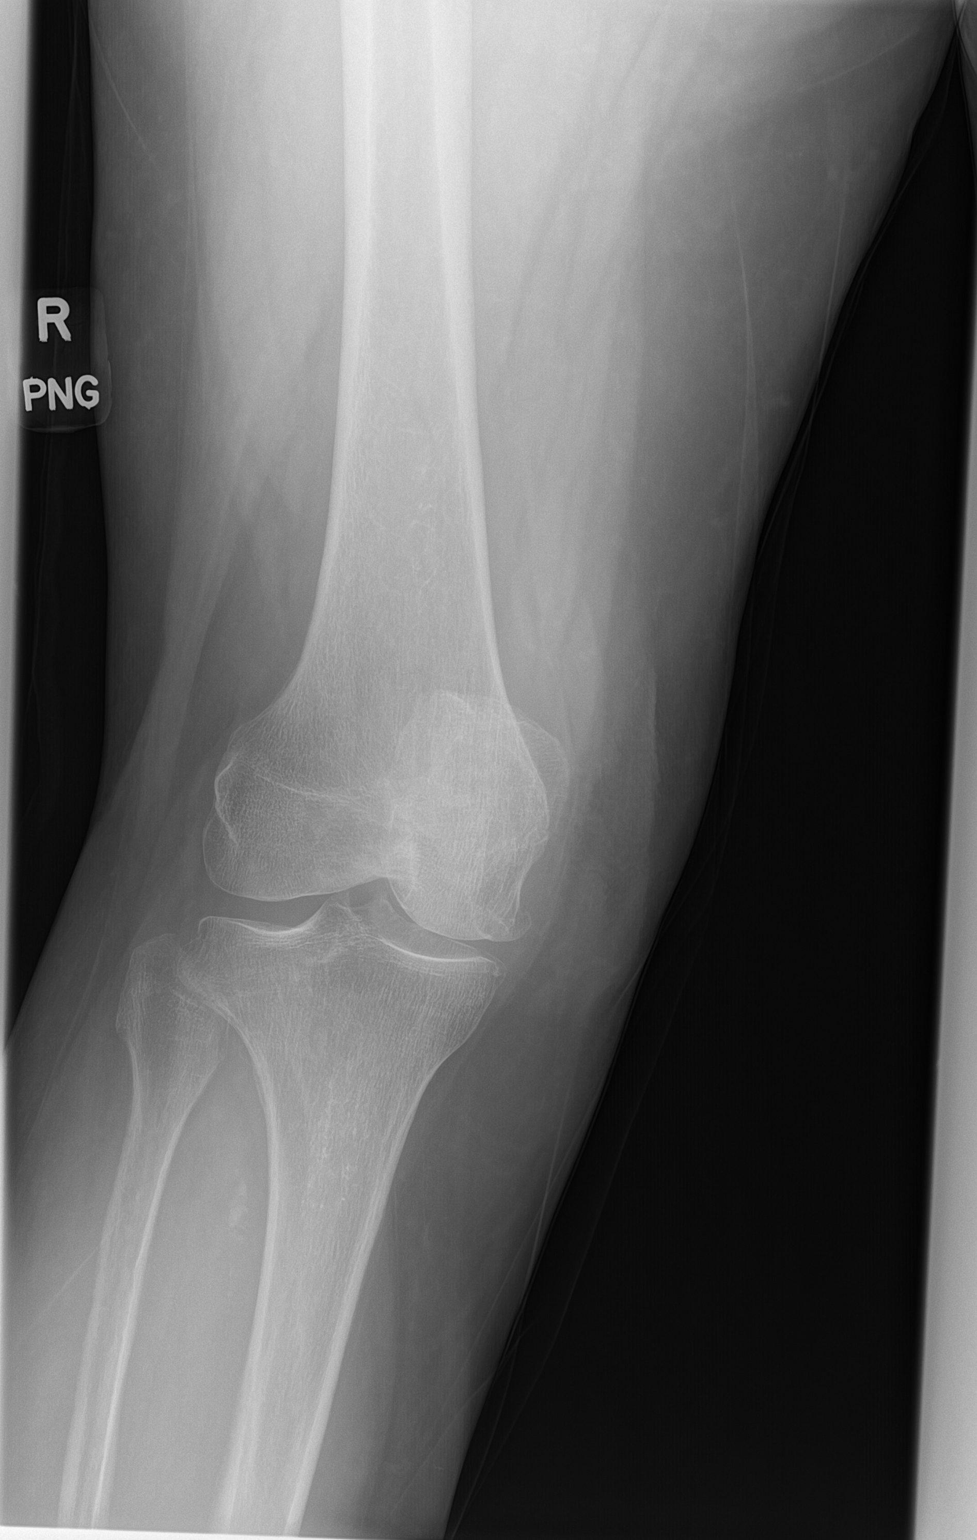

[knee lat]
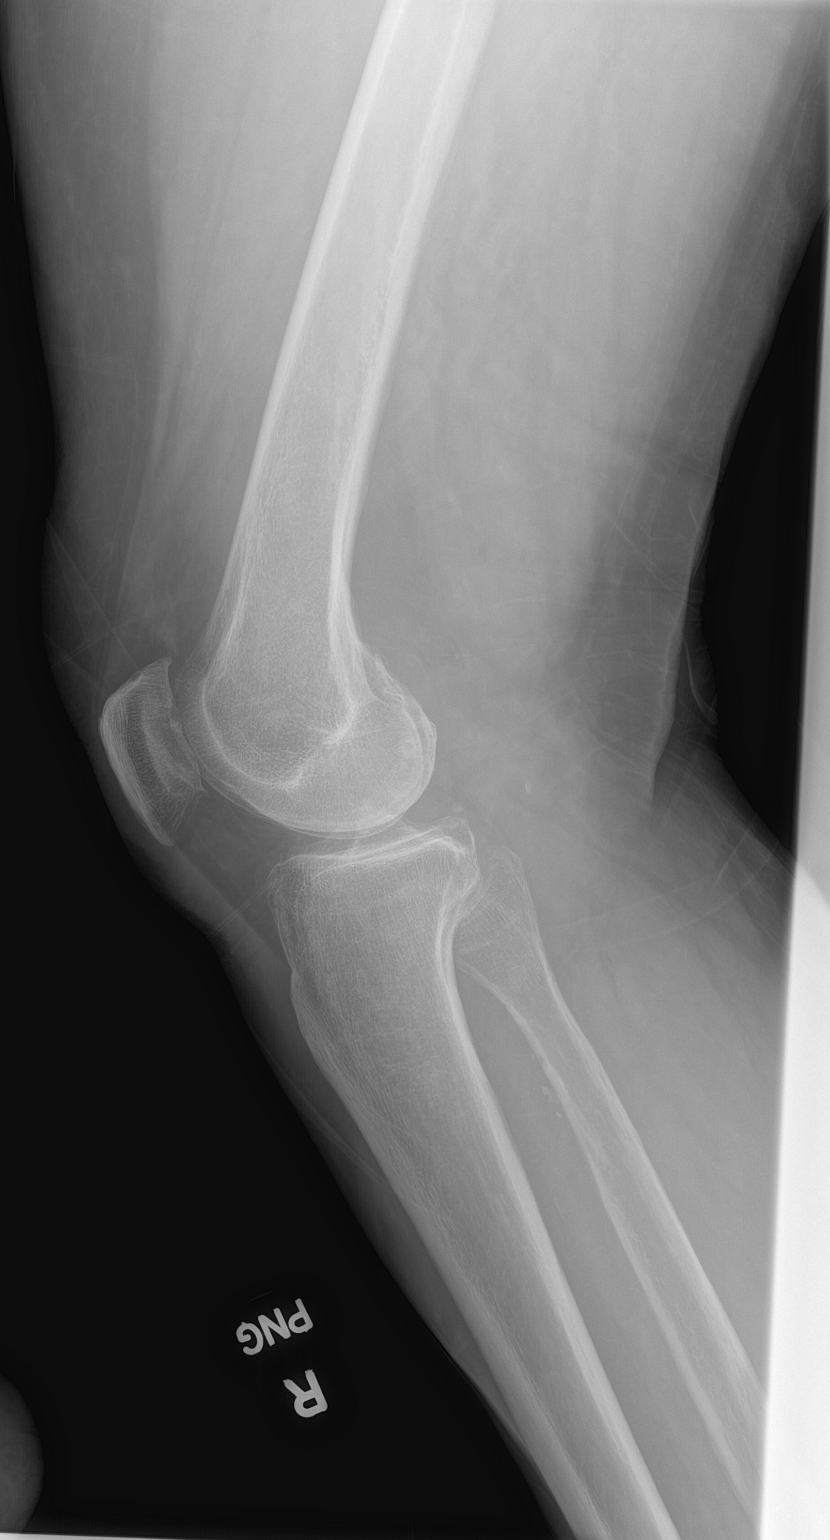

[4 of 4 positions shown; findings below may reference images not displayed]

FINDINGS: No fracture or dislocation is seen.

Mild degenerative changes with sharpening of the tibial spines and
medial and patellofemoral osteophytes.

Visualized soft tissues are within normal limits.

No suprapatellar knee joint effusion.
IMPRESSION: Very mild degenerative changes, as above.

## 2023-03-30 ENCOUNTER — Other Ambulatory Visit: Payer: Medicare Other

## 2023-03-31 ENCOUNTER — Ambulatory Visit
Admission: RE | Admit: 2023-03-31 | Discharge: 2023-03-31 | Disposition: A | Discharge status: Home / Self Care | Payer: Medicare Other | Source: Ambulatory Visit | Attending: Internal Medicine | Admitting: Internal Medicine

## 2023-03-31 DIAGNOSIS — K828 Other specified diseases of gallbladder: Secondary | ICD-10-CM | POA: Diagnosis not present

## 2023-03-31 DIAGNOSIS — K7689 Other specified diseases of liver: Secondary | ICD-10-CM | POA: Diagnosis not present

## 2023-03-31 DIAGNOSIS — R748 Abnormal levels of other serum enzymes: Secondary | ICD-10-CM

## 2023-03-31 DIAGNOSIS — R7401 Elevation of levels of liver transaminase levels: Secondary | ICD-10-CM | POA: Diagnosis not present

## 2023-04-01 ENCOUNTER — Other Ambulatory Visit: Payer: Self-pay | Admitting: Internal Medicine

## 2023-04-01 DIAGNOSIS — K7460 Unspecified cirrhosis of liver: Secondary | ICD-10-CM | POA: Insufficient documentation

## 2023-04-01 DIAGNOSIS — R748 Abnormal levels of other serum enzymes: Secondary | ICD-10-CM

## 2023-04-03 ENCOUNTER — Other Ambulatory Visit: Payer: Self-pay | Admitting: Cardiovascular Disease

## 2023-04-06 ENCOUNTER — Encounter: Payer: Self-pay | Admitting: Physician Assistant

## 2023-04-06 ENCOUNTER — Ambulatory Visit: Payer: Medicare Other | Attending: Physician Assistant | Admitting: Physician Assistant

## 2023-04-06 VITALS — BP 136/74 | HR 89 | Ht 64.0 in | Wt 195.0 lb

## 2023-04-06 DIAGNOSIS — I428 Other cardiomyopathies: Secondary | ICD-10-CM

## 2023-04-06 DIAGNOSIS — I350 Nonrheumatic aortic (valve) stenosis: Secondary | ICD-10-CM

## 2023-04-06 DIAGNOSIS — I10 Essential (primary) hypertension: Secondary | ICD-10-CM

## 2023-04-06 DIAGNOSIS — I471 Supraventricular tachycardia, unspecified: Secondary | ICD-10-CM

## 2023-04-06 DIAGNOSIS — I2510 Atherosclerotic heart disease of native coronary artery without angina pectoris: Secondary | ICD-10-CM

## 2023-04-06 DIAGNOSIS — I5042 Chronic combined systolic (congestive) and diastolic (congestive) heart failure: Secondary | ICD-10-CM

## 2023-04-06 NOTE — Patient Instructions (Signed)
Medication Instructions:  Your physician recommends that you continue on your current medications as directed. Please refer to the Current Medication list given to you today.   *If you need a refill on your cardiac medications before your next appointment, please call your pharmacy*   Lab Work: NONE ORDERED  TODAY    If you have labs (blood work) drawn today and your tests are completely normal, you will receive your results only by: MyChart Message (if you have MyChart) OR A paper copy in the mail If you have any lab test that is abnormal or we need to change your treatment, we will call you to review the results.   Testing/Procedures: NONE ORDERED  TODAY      Follow-Up: At [facility], you and your health needs are our priority.  As part of our continuing mission to provide you with exceptional heart care, we have created designated Provider Care Teams.  These Care Teams include your primary Cardiologist (physician) and Advanced Practice Providers (APPs -  Physician Assistants and Nurse Practitioners) who all work together to provide you with the care you need, when you need it.  We recommend signing up for the patient portal called "MyChart".  Sign up information is provided on this After Visit Summary.  MyChart is used to connect with patients for Virtual Visits (Telemedicine).  Patients are able to view lab/test results, encounter notes, upcoming appointments, etc.  Non-urgent messages can be sent to your provider as well.   To learn more about what you can do with MyChart, go to [email or web address].    Your next appointment:     PATIENT WILL CONTACT BACK WITH FOLLOW UP IF ANY NEEDED     Other Instructions

## 2023-04-06 NOTE — Progress Notes (Signed)
Cardiology Office Note:  .   Date:  04/06/2023  ID:  Madison Gutierrez, DOB [birth year], MRN [id number] PCP: Myrlene Broker, MD  [facility] Providers Cardiologist:  Verne Carrow, MD {   History of Present Illness: .   Madison Gutierrez is a 77 y.o. female with past medical history of hypertension, aortic stenosis, chronic LBBB, mild MR, mild CAD, nonischemic cardiomyopathy and SVT here for cardiac follow-up.  Patient was seen back by Dr. Clifton James August 2012 for evaluation of palpitations.  Her EKG primarily showed PVCs.  Arrange an echo and a 48-hour Holter monitor August 2012.  Echo showed normal LV size and function with EF of 70%.  There was mild aortic valve disease with mild stenosis and regurgitation.  Her Holter monitor showed normal sinus rhythm at baseline with frequent PVCs and several runs of SVT (longest 104 beats at 177 bpm).  Which she was started then on Cardizem.  Echo March 2018 with LVEF  60 to 65%, aortic valve sclerosis with mild aortic stenosis.  Seen in the office by Dr. Clifton James 11/13/2018 with complaint of chest pressure and dyspnea on exertion.  Echo 11/24/2018 with LVEF 35 to 40% with severe hypokinesis of the inferior septal and anterior septal walls.  Image quality was poor.  Aortic leaflets were thickened with restricted opening and mean gradient of 14 mmHg consistent with mild aortic stenosis.  Mild MR.  Underwent nuclear stress test 11/24/2018 without any large areas of ischemia.  Cardiac cath 12/29/2018 showed mild nonobstructive CAD and mild aortic stenosis with normal LV systolic function.  LVEDP was normal.  She was seen by [name], PA 11/14/2019 reported worsening dyspnea on exertion and a gradual continue home from the year prior.  Blood pressure was not well-controlled.  She also noted frequent, daily chest "aching" unchanged ever since before her catheterization, not anginal or exertional in nature.  She also reported episodic wheezing.  She had gained  weight but patient strongly felt that this body weight gain rather than fluid weight gain.  Echo 11/26/2019 showed EF 40% with global hypokinesis with septal lateral dyssynchrony consistent with LBBB, grade 1 DD, mild MR, mild dilation of aortic root.  Attempted to change low-dose lisinopril to [facility].  She did not tolerate Entresto at this time.  Aldactone was added.  She increased her lisinopril dose and stopped metoprolol succinate and her dyspnea improved.  Cardiac MRI April 2021 with LVEF 42% and global hypokinesis with asymmetric septal hypertrophy with no SAM or LVOT obstruction.  Suggestive of hypertensive heart disease.  No findings to suggest an infiltrative process.  Echo February 2023 with LVEF 45 to 50%, mild MR, mild AAS (mean gradient 60 mmHg).  She was last seen by Dr. Clifton James 02/12/2022 and at that time was doing well without any chest pain, dyspnea, palpitations, lower extremity edema, orthopnea, PND, dizziness, near-syncope, or syncope.  Blood pressure had been elevated at home.  She had restarted Entresto at home the previous week.  Lisinopril was discontinued at that time and she was encouraged to continue Entresto and Aldactone.  BMP in a week to recheck kidney function.  Today, this is the first time I am meeting this patient.  She is very upset because she was taking lisinopril and Entresto at the same time and she felt very poorly.  She was having dizziness lightheadedness which was likely due to her blood pressure being too low.  She tells me that Madison Gutierrez contributed to her cirrhosis of the  liver which was recently found on ultrasound through her primary Dr. [name].  I did reach out to our Pharm.D. on staff today and there have been no reported incidences of Entresto causing cirrhosis of the liver.  However, patient has had liver issues in the past.  She had hepatitis as a child and has been dealing with fatty liver for years.  Unfortunately, this has progressed to cirrhosis and  now she is requiring a MRI of the liver which is scheduled.  Her LFTs increased significantly on Entresto and currently she is not taking this medication and is back on her lisinopril.  Blood pressure is well-controlled today.  LFTs are trending downward after stopping Entresto.  She has been referred to a GI specialist for further treatment.  She does chair yoga which she finds great joy in.  Otherwise, doing well.  Reports no shortness of breath nor dyspnea on exertion. Reports no chest pain, pressure, or tightness. No edema, orthopnea, PND. Reports no palpitations.   ROS: Pertinent ROS located in HPI  Studies Reviewed: .        None reviewed today          Physical Exam:   VS:  BP 136/74   Pulse 89   Ht 5\' 4"  (1.626 m)   Wt 195 lb (88.5 kg)   SpO2 97%   BMI 33.47 kg/m    Wt Readings from Last 3 Encounters:  04/06/23 195 lb (88.5 kg)  12/17/22 199 lb 4 oz (90.4 kg)  02/12/22 201 lb 3.2 oz (91.3 kg)    GEN: Well nourished, well developed in no acute distress NECK: No JVD; No carotid bruits CARDIAC: RRR, + systolic murmurs, rubs, gallops RESPIRATORY:  Clear to auscultation without rales, wheezing or rhonchi  ABDOMEN: Soft, non-tender, non-distended EXTREMITIES:  No edema; No deformity   ASSESSMENT AND PLAN: .   1.  Chronic combined systolic and diastolic CHF -Patient upset because she was on lisinopril and Entresto at the same time which should have never happened. -She is also upset because of recent diagnosis of liver cirrhosis -Would continue current medication regimen with lisinopril 20 mg daily -Continue spironolactone 12.5 mg daily -She prefers a holistic approach to her health  2.  Nonischemic cardiomyopathy -LVEF 45 to 50% by echocardiogram February 2023.  Cardiac MRI April 2021 with LVEF 42% with global hypokinesis with asymmetric septal hypertrophy with no SAM or LVOT obstruction.  Suggestive of hypertensive heart disease. -Continue lisinopril, blood pressure  well-controlled today  3.  LBBB -Chronic and stable  4.  Aortic valve stenosis -Murmur present today which is chronic -Mild by echocardiogram February 2023. -Repeat echocardiogram February 2025  5.  Hypertension -Blood pressure well-controlled today -Continue current dose of lisinopril -Continue low-sodium, heart healthy diet -Continue to monitor blood pressure at home  6.  SVT -No palpitations mentioned today  7.  CAD without angina -No chest pain mention today -Prefers not to make any medication changes at this time      Dispo: Patient plans to call and make an appointment  Signed, Sharlene Dory, PA-C

## 2023-04-08 ENCOUNTER — Encounter: Payer: Self-pay | Admitting: Physician Assistant

## 2023-05-02 ENCOUNTER — Other Ambulatory Visit: Payer: Self-pay | Admitting: Cardiovascular Disease

## 2023-05-04 ENCOUNTER — Encounter: Payer: Self-pay | Admitting: Internal Medicine

## 2023-05-20 ENCOUNTER — Ambulatory Visit: Admission: RE | Admit: 2023-05-20 | Payer: Medicare Other | Source: Ambulatory Visit

## 2023-05-20 DIAGNOSIS — K7460 Unspecified cirrhosis of liver: Secondary | ICD-10-CM

## 2023-05-20 DIAGNOSIS — R748 Abnormal levels of other serum enzymes: Secondary | ICD-10-CM

## 2023-05-20 DIAGNOSIS — R161 Splenomegaly, not elsewhere classified: Secondary | ICD-10-CM | POA: Diagnosis not present

## 2023-05-20 MED ORDER — GADOPICLENOL 0.5 MMOL/ML IV SOLN
9.0000 mL | Freq: Once | INTRAVENOUS | Status: AC | PRN
  Administered 2023-05-20: 9 mL via INTRAVENOUS

## 2023-05-23 ENCOUNTER — Encounter: Payer: Self-pay | Admitting: Internal Medicine

## 2023-06-21 ENCOUNTER — Ambulatory Visit: Payer: Medicare Other | Admitting: Physician Assistant

## 2023-06-21 ENCOUNTER — Encounter: Payer: Self-pay | Admitting: Physician Assistant

## 2023-06-21 ENCOUNTER — Other Ambulatory Visit (INDEPENDENT_AMBULATORY_CARE_PROVIDER_SITE_OTHER): Payer: Medicare Other

## 2023-06-21 VITALS — BP 132/78 | HR 76 | Ht 64.0 in | Wt 192.2 lb

## 2023-06-21 DIAGNOSIS — I8510 Secondary esophageal varices without bleeding: Secondary | ICD-10-CM | POA: Diagnosis not present

## 2023-06-21 DIAGNOSIS — K7581 Nonalcoholic steatohepatitis (NASH): Secondary | ICD-10-CM | POA: Diagnosis not present

## 2023-06-21 DIAGNOSIS — R748 Abnormal levels of other serum enzymes: Secondary | ICD-10-CM | POA: Diagnosis not present

## 2023-06-21 DIAGNOSIS — K7460 Unspecified cirrhosis of liver: Secondary | ICD-10-CM

## 2023-06-21 LAB — CBC WITH DIFFERENTIAL/PLATELET
Basophils Absolute: 0 10*3/uL (ref 0.0–0.1)
Basophils Relative: 0.6 % (ref 0.0–3.0)
Eosinophils Absolute: 0.2 10*3/uL (ref 0.0–0.7)
Eosinophils Relative: 3.2 % (ref 0.0–5.0)
HCT: 43.4 % (ref 36.0–46.0)
Hemoglobin: 14.5 g/dL (ref 12.0–15.0)
Lymphocytes Relative: 37.7 % (ref 12.0–46.0)
Lymphs Abs: 2.9 10*3/uL (ref 0.7–4.0)
MCHC: 33.5 g/dL (ref 30.0–36.0)
MCV: 93.1 fl (ref 78.0–100.0)
Monocytes Absolute: 0.5 10*3/uL (ref 0.1–1.0)
Monocytes Relative: 6.6 % (ref 3.0–12.0)
Neutro Abs: 4 10*3/uL (ref 1.4–7.7)
Neutrophils Relative %: 51.9 % (ref 43.0–77.0)
Platelets: 128 10*3/uL — ABNORMAL LOW (ref 150.0–400.0)
RBC: 4.66 Mil/uL (ref 3.87–5.11)
RDW: 13.7 % (ref 11.5–15.5)
WBC: 7.6 10*3/uL (ref 4.0–10.5)

## 2023-06-21 LAB — COMPREHENSIVE METABOLIC PANEL
ALT: 43 U/L — ABNORMAL HIGH (ref 0–35)
AST: 49 U/L — ABNORMAL HIGH (ref 0–37)
Albumin: 3.9 g/dL (ref 3.5–5.2)
Alkaline Phosphatase: 110 U/L (ref 39–117)
BUN: 15 mg/dL (ref 6–23)
CO2: 28 meq/L (ref 19–32)
Calcium: 9.9 mg/dL (ref 8.4–10.5)
Chloride: 102 meq/L (ref 96–112)
Creatinine, Ser: 0.61 mg/dL (ref 0.40–1.20)
GFR: 85.83 mL/min (ref >60.00)
Glucose, Bld: 84 mg/dL (ref 70–99)
Potassium: 4.2 meq/L (ref 3.5–5.1)
Sodium: 138 meq/L (ref 135–145)
Total Bilirubin: 0.6 mg/dL (ref 0.2–1.2)
Total Protein: 7.5 g/dL (ref 6.0–8.3)

## 2023-06-21 LAB — PROTIME-INR
INR: 1.2 ratio — ABNORMAL HIGH (ref 0.8–1.0)
Prothrombin Time: 12.9 s (ref 9.6–13.1)

## 2023-06-21 NOTE — Patient Instructions (Addendum)
_______________________________________________________  If your blood pressure at your visit was 140/90 or greater, please contact your primary care physician to follow up on this. _______________________________________________________  If you are age 78 or older, your body mass index should be between 23-30. Your Body mass index is 32.99 kg/m. If this is out of the aforementioned range listed, please consider follow up with your Primary Care Provider. ________________________________________________________  Your provider has requested that you go to the basement level for lab work before leaving today. Press "B" on the elevator. The lab is located at the first door on the left as you exit the elevator. ___________________________________________________________________________  Please continue same medication regimen.  It has been recommended to you by your physician that you have an endoscopy completed. Per your request, we did not schedule the procedure(s) today. Please contact our office at [phone] should you decide to have the procedure completed. You will be scheduled for a pre-visit and procedure at that time.  Due to recent changes in healthcare laws, you may see the results of your imaging and laboratory studies on MyChart before your provider has had a chance to review them.  We understand that in some cases there may be results that are confusing or concerning to you. Not all laboratory results come back in the same time frame and the provider may be waiting for multiple results in order to interpret others.  Please give [place] 48 hours in order for your provider to thoroughly review all the results before contacting the office for clarification of your results.   The [facility] providers would like to encourage you to use [facility] to communicate with providers for non-urgent requests or questions.  Due to long hold times on the telephone, sending your provider a message by [facility]  may be a faster and more efficient way to get a response.  Please allow 48 business hours for a response.  Please remember that this is for non-urgent requests.  _______________________________________________________  Thank you for entrusting me with your care and choosing [facility].  Amy Esterwood, PA-C

## 2023-06-27 ENCOUNTER — Encounter: Payer: Self-pay | Admitting: Physician Assistant

## 2023-06-27 NOTE — Progress Notes (Signed)
Subjective:    Patient ID: Madison Gutierrez, female    DOB: 25-May-1946, 77 y.o.   MRN: [id number]  HPI Madison Gutierrez is a 77 year old white female, known remotely to Dr. Rhea Belton having last been seen here in 2012.  She is referred back now by Dr. Hale Drone care for evaluation of new diagnosis of cirrhosis. Patient does have history of fatty liver which was present on ultrasound in 2012 which noted diffuse increased hepatic echogenicity. She had undergone workup per Dr. Rhea Belton to include autoimmune, and inheritable serologic markers which were negative.  She was to have hemochromatosis gene analysis due to noting elevated ferritin of 554 but that was not completed. She did have elevated AST/ALT, and was felt most likely to have NASH, in combination with component of EtOH with at that time 2 glasses of wine per day chronically. Patient does have other comorbidities including hypertension, dilated cardiomyopathy/nonischemic with most recent echo 2023 showing an EF of 45 to 50% and mild changes of aortic stenosis, left bundle branch block, and history of rheumatoid arthritis. Patient is upset because she had been placed on Entresto which she took for about 14 months and believes that the [name] is what caused her to develop cirrhosis. She has been off Entresto since the finding of cirrhosis. She did undergo ultrasound in June 2024 which showed a mildly nodular contour of the liver with coarsening and markedly heterogeneous echogenicity.  No mass, portal vein patent, no splenomegaly, and no ascites This was done after noting routine labs with elevated LFTs with AST 95/ALT 79 CBC from March 2024 hemoglobin 16 and platelets 119.  Subsequent MRI of the abdomen August 2024 with coarse nodular cirrhotic morphology of the liver, no suspicious liver lesion, no gallstones, unremarkable pancreas, mild splenomegaly, and no ascites.  Patient says she feels okay, he does not have any specific symptoms referable to  liver disease, no abdominal pain, no issues with fluid retention. She does drink wine on a regular basis, though not daily, and has decreased since learning of cirrhosis.  Asks if she can continue to drink wine on an occasional basis socially.  Patient had GI evaluation with [facility] in 2018 with colonoscopy and EGD.  She says she requested to be reestablished here to keep all of her records within the [facility] system.  I do not have results of those procedures available today. Colonoscopy here in 2012 with removal of 2 less than 5 mm polyps and moderate diverticulosis.  Interestingly random biopsies were positive for microscopic colitis but she has not had any symptoms of diarrhea for many years.  Path on the polyps pertinent for 1 sessile serrated adenoma.  Review of Systems  Pertinent positive and negative review of systems were noted in the above HPI section.  All other review of systems was otherwise negative.   Outpatient Encounter Medications as of 06/21/2023  Medication Sig   Calcium Carb-Cholecalciferol (CALCIUM 1000 + D PO) Take 1 tablet by mouth in the morning and at bedtime.   Cholecalciferol (VITAMIN D) 125 MCG (5000 UT) CAPS Take by mouth daily.   Choline Bitartrate CRYS Take 650 mg by mouth in the morning and at bedtime.   Coenzyme Q10 (COQ10) 200 MG CAPS Take 200 mg by mouth daily.   [employer]S CINNAMON PO Take 6,000 mg by mouth in the morning and at bedtime.   Cyanocobalamin (VITAMIN B12 PO) Take 1 tablet by mouth daily.   ibuprofen (ADVIL) 200 MG tablet Take 200 mg by mouth every  6 (six) hours as needed for headache or moderate pain.   lisinopril (ZESTRIL) 20 MG tablet Take 1 tablet (20 mg total) by mouth daily.   MILK THISTLE PO Take 1 capsule by mouth in the morning and at bedtime.   Multiple Vitamin (MULTIVITAMIN WITH MINERALS) TABS tablet Take 1 tablet by mouth daily.   spironolactone (ALDACTONE) 25 MG tablet Take 1/2 tablet by mouth once a day.   No facility-administered encounter  medications on file as of 06/21/2023.   Allergies  Allergen Reactions   Dairy Aid [Tilactase]    Entresto [Sacubitril-Valsartan]     Weak, shaky, dizzy   Petrolatum-Zinc Oxide Rash   Sulfa Antibiotics Rash   Sulfamethoxazole Rash   Patient Active Problem List   Diagnosis Date Noted   Cirrhosis of liver without ascites (HCC) 04/01/2023   Routine general medical examination at a health care facility 12/17/2022   Rash 12/17/2022   Chronic pain of right knee 04/09/2021   Family history of diabetes mellitus 04/09/2021   Hyperlipidemia 04/09/2021   Dilated cardiomyopathy (HCC)    Right arm pain 05/05/2018   Chronic low back pain 10/21/2017   Right foot pain 07/09/2015   Obesity 03/25/2015   HTN (hypertension) 06/21/2011   Psoriasis 06/21/2011   Elevated liver enzymes 06/21/2011   Palpitations 06/08/2011   Social History   Socioeconomic History   Marital status: Married    Spouse name: Not on file   Number of children: 3   Years of education: Not on file   Highest education level: Associate degree: academic program  Occupational History   Occupation: retired    Associate Professor: RETIRED  Tobacco Use   Smoking status: Never   Smokeless tobacco: Never  Vaping Use   Vaping status: Never Used  Substance and Sexual Activity   Alcohol use: Yes    Alcohol/week: 14.0 standard drinks of alcohol    Types: 14 Glasses of wine per week    Comment: at least two glasses of wine sometimes more nightly   Drug use: No   Sexual activity: Not Currently    Partners: Male    Birth control/protection: Post-menopausal  Other Topics Concern   Not on file  Social History Narrative   Not on file   Social Determinants of Health   Financial Resource Strain: Low Risk  (03/19/2023)   Overall Financial Resource Strain (CARDIA)    Difficulty of Paying Living Expenses: Not hard at all  Food Insecurity: No Food Insecurity (03/19/2023)   Hunger Vital Sign    Worried About Running Out of Food in the Last  Year: Never true    Ran Out of Food in the Last Year: Never true  Transportation Needs: No Transportation Needs (03/19/2023)   PRAPARE - Administrator, Civil Service (Medical): No    Lack of Transportation (Non-Medical): No  Physical Activity: Unknown (03/19/2023)   Exercise Vital Sign    Days of Exercise per Week: 0 days    Minutes of Exercise per Session: Not on file  Stress: Not on file  Social Connections: Moderately Isolated (03/19/2023)   Social Connection and Isolation Panel [NHANES]    Frequency of Communication with Friends and Family: More than three times a week    Frequency of Social Gatherings with Friends and Family: Once a week    Attends Religious Services: Never    Database administrator or Organizations: No    Attends Banker Meetings: Not on file    Marital  Status: Married  Catering manager Violence: Not on file    Ms. Carranco's family history includes Heart attack in her father and mother; Heart disease in her father and mother; Hypertension in her brother, father, maternal grandmother, and mother; Prostate cancer in her brother.      Objective:    Vitals:   06/21/23 1402  BP: 132/78  Pulse: 76    Physical Exam.Well-developed well-nourished elderly white female in no acute distress.  Height, Weight, 192 BMI 32.9  HEENT; nontraumatic normocephalic, EOMI, PE R LA, sclera anicteric. Oropharynx; not examined today Neck; supple, no JVD Cardiovascular; regular rate and rhythm with S1-S2,  Pulmonary; Clear bilaterally Abdomen; soft, obese, small umbilical hernia with increased venous prominence nontender, nondistended, no palpable mass or hepatosplenomegaly no fluid wave, bowel sounds are active  Rectal; not done today Skin; benign exam, no jaundice rash or appreciable lesions Extremities; no clubbing cyanosis or edema skin warm and dry Neuro/Psych; alert and oriented x4, grossly nonfocal mood and affect appropriate        Assessment &  Plan:   #20 77 year old white female with history of previously documented fatty liver disease/most likely NASH with recent imaging with ultrasound and MRI of the abdomen both showing evidence of cirrhosis. MRI done earlier this month with cirrhotic appearing liver, no suspicious mass, mild splenomegaly, no ascites  She has had elevated AST and ALT for several years with no significant change in that pattern. Serologic workup in 2012 all negative.  She did have an elevated ferritin at that time was to have hemochromatosis gene analysis which was not completed. Patient also has had regular EtOH use  #2 mild thrombocytopenia likely secondary to cirrhosis #3.  Prior history of adenomatous and sessile serrated polyps-will obtain copies of prior colonoscopy which was the most recent 2018/[facility]  #4 very remote random biopsies positive for microscopic colitis 2012/has been asymptomatic #5.  Hypertension #6.  Nonischemic cardiomyopathy with EF 45 to 50% #7.  Left bundle branch block #8.  History of SVT  Plan; check AFP, pro time/INR, CBC with differential, c-Met, hemochromatosis DNA/PCR Long discussion today with patient regarding cirrhosis, potential progression, management of complications.  Her cirrhosis appears compensated at present, and she was reassured. Expect she will be Marjo Bicker class A Will calculate MELD pending above labs We discussed need for regular follow-up with GI on an every 57-month basis for labs and imaging. She will need EGD for surveillance for varices. Discussed best advice to avoid EtOH, low-salt diet Will need repeat ultrasound December 2024. Patient decided not to schedule EGD at this time, she is planning on traveling over the next couple of months and will be back in [place] in November.  We will arrange for office follow-up with Dr. Rhea Belton at that time and then can be scheduled for follow-up ultrasound and an EGD.  Elease Swarm Oswald Hillock PA-C 06/27/2023   Cc: Myrlene Broker, *

## 2023-06-28 NOTE — Progress Notes (Signed)
Addendum: Reviewed and agree with assessment and management plan. Kadijah Shamoon M, MD  

## 2023-07-01 LAB — AFP TUMOR MARKER: AFP-Tumor Marker: 9 ng/mL — ABNORMAL HIGH

## 2023-07-01 LAB — HEMOCHROMATOSIS DNA-PCR(C282Y,H63D)

## 2023-09-12 ENCOUNTER — Encounter: Payer: Medicare Other | Admitting: Internal Medicine

## 2023-10-06 DIAGNOSIS — M545 Low back pain, unspecified: Secondary | ICD-10-CM | POA: Diagnosis not present

## 2023-10-24 ENCOUNTER — Ambulatory Visit: Payer: Medicare Other | Admitting: Internal Medicine

## 2023-10-31 ENCOUNTER — Other Ambulatory Visit (INDEPENDENT_AMBULATORY_CARE_PROVIDER_SITE_OTHER): Payer: Medicare Other

## 2023-10-31 ENCOUNTER — Encounter: Payer: Self-pay | Admitting: Gastroenterology

## 2023-10-31 ENCOUNTER — Ambulatory Visit: Payer: Medicare Other | Admitting: Gastroenterology

## 2023-10-31 VITALS — BP 132/80 | HR 77 | Ht 64.0 in | Wt 195.0 lb

## 2023-10-31 DIAGNOSIS — K7460 Unspecified cirrhosis of liver: Secondary | ICD-10-CM | POA: Diagnosis not present

## 2023-10-31 LAB — CBC WITH DIFFERENTIAL/PLATELET
Basophils Absolute: 0 10*3/uL (ref 0.0–0.1)
Basophils Relative: 0.7 % (ref 0.0–3.0)
Eosinophils Absolute: 0.4 10*3/uL (ref 0.0–0.7)
Eosinophils Relative: 5.4 % — ABNORMAL HIGH (ref 0.0–5.0)
HCT: 45.5 % (ref 36.0–46.0)
Hemoglobin: 15.4 g/dL — ABNORMAL HIGH (ref 12.0–15.0)
Lymphocytes Relative: 33.8 % (ref 12.0–46.0)
Lymphs Abs: 2.4 10*3/uL (ref 0.7–4.0)
MCHC: 33.8 g/dL (ref 30.0–36.0)
MCV: 92.7 fL (ref 78.0–100.0)
Monocytes Absolute: 0.6 10*3/uL (ref 0.1–1.0)
Monocytes Relative: 8.1 % (ref 3.0–12.0)
Neutro Abs: 3.6 10*3/uL (ref 1.4–7.7)
Neutrophils Relative %: 52 % (ref 43.0–77.0)
Platelets: 142 10*3/uL — ABNORMAL LOW (ref 150.0–400.0)
RBC: 4.91 Mil/uL (ref 3.87–5.11)
RDW: 13.8 % (ref 11.5–15.5)
WBC: 7 10*3/uL (ref 4.0–10.5)

## 2023-10-31 LAB — COMPREHENSIVE METABOLIC PANEL
ALT: 34 U/L (ref 0–35)
AST: 36 U/L (ref 0–37)
Albumin: 4.1 g/dL (ref 3.5–5.2)
Alkaline Phosphatase: 108 U/L (ref 39–117)
BUN: 13 mg/dL (ref 6–23)
CO2: 27 meq/L (ref 19–32)
Calcium: 9.5 mg/dL (ref 8.4–10.5)
Chloride: 102 meq/L (ref 96–112)
Creatinine, Ser: 0.57 mg/dL (ref 0.40–1.20)
GFR: 87.03 mL/min (ref >60.00)
Glucose, Bld: 90 mg/dL (ref 70–99)
Potassium: 4.3 meq/L (ref 3.5–5.1)
Sodium: 138 meq/L (ref 135–145)
Total Bilirubin: 0.5 mg/dL (ref 0.2–1.2)
Total Protein: 7.3 g/dL (ref 6.0–8.3)

## 2023-10-31 LAB — PROTIME-INR
INR: 1.3 {ratio} — ABNORMAL HIGH (ref 0.8–1.0)
Prothrombin Time: 13.3 s — ABNORMAL HIGH (ref 9.6–13.1)

## 2023-10-31 NOTE — Progress Notes (Signed)
Chief Complaint: Cirrhosis Primary GI MD: Dr. Rhea Belton  HPI: 78 year old female history of previously documented fatty liver disease most consistent with NASH recently diagnosed with cirrhosis presents for follow-up.  Seen September 2024 by Mike Gip PA-C.  At that time she had an MRI done which showed a cirrhotic appearing liver, no suspicious mass, mild splenomegaly, no ascites.  She was also noted to have mild thrombocytopenia likely secondary to her cirrhosis with a mild elevated AST and ALT for several years with no significant change.  She did have a serologic workup in 2012 which was negative.  Regular EtOH use.  She was recommended to have upper endoscopy to rule out varices but appears this was not completed.  Workup September 2024 showed MELD score of 8, Childs class A, hemochromatosis DNA negative, and AFP marker 9.0.   The patient, diagnosed with cirrhosis in September, presents for a follow-up visit. History of fatty liver. She has been managing her fatty liver with dietary supplements for the past 15 years. She suspects that her prescription medication, [name], may may be the sole reason that she has cirrhosis.  She has done research into this medicine and she feels this is what "all of a sudden" caused her cirrhosis.  She feels alcohol is not because she partakes rare but socially.  The patient has a history of hepatitis in childhood, referred to as "yellow jaundice" at the time. She also has a history of heart failure and a valve issue. She reports a generally healthy lifestyle, with few illnesses and no regular vaccinations, except for a one-time COVID-19 vaccination, which she reports led to three instances of upper respiratory infection.  She reports she does not feel vaccines are necessary and she does not regularly get the pneumonia coccal, influenza, or hepatitis B vaccine series which is what led her to transferring from her previous GI.  She acknowledges the need to  abstain from alcohol due to her cirrhosis but admits to occasional wine consumption. She expresses a desire to maintain a quality of life despite her age and health conditions.     Patient has a lot going on with other extrinsic factors at this time and does not feel an EGD is needed.  She is currently dealing with dental issues and wants to get that fixed prior to undergoing EGD for varices.    PREVIOUS GI WORKUP   EGD with [facility] 2018 - Normal esophagus - Gastritis - Normal duodenum  Colonoscopy with [facility] 2018 - Entire esophagus normal - No specimens collected  Colonoscopy here in 2012 with removal of 2 less than 5 mm polyps and moderate diverticulosis.  Interestingly random biopsies were positive for microscopic colitis but she has not had any symptoms of diarrhea for many years.  Path on the polyps pertinent for 1 sessile serrated adenoma.  Past Medical History:  Diagnosis Date   Abnormal liver function test    Asthma    mild asthma from dairy allergies   Elevated LFTs 05/19/11   Frequent PVCs    GERD (gastroesophageal reflux disease)    Hepatic steatosis 05/24/11   Hepatitis, unspecified    Hypertension    Mild aortic stenosis    Mild CAD    a. by cath 12/2018.   Obesity    Osteopenia 05/2011   t score -1.9   Psoriasis    Rheumatoid arthritis(714.0)    Staph aureus infection 2007   rt. shin   SVT (supraventricular tachycardia) (HCC)    Ulcer  Past Surgical History:  Procedure Laterality Date   APPENDECTOMY  1998   FACIAL RECONSTRUCTION SURGERY     HERNIA REPAIR  2003   bi-lat  repair    OVARIAN CYST REMOVAL  1978   from left ovary    RIGHT/LEFT HEART CATH AND CORONARY ANGIOGRAPHY N/A 12/29/2018   Procedure: RIGHT/LEFT HEART CATH AND CORONARY ANGIOGRAPHY;  Surgeon: Kathleene Hazel, MD;  Location: [facility];  Service: Cardiovascular;  Laterality: N/A;   tummy tuck  2003    Current Outpatient Medications  Medication Sig Dispense Refill    Calcium Carb-Cholecalciferol (CALCIUM 1000 + D PO) Take 1 tablet by mouth in the morning and at bedtime.     Cholecalciferol (VITAMIN D) 125 MCG (5000 UT) CAPS Take by mouth daily.     Choline Bitartrate CRYS Take 650 mg by mouth in the morning and at bedtime.     Coenzyme Q10 (COQ10) 200 MG CAPS Take 200 mg by mouth daily.     CVS CINNAMON PO Take 6,000 mg by mouth in the morning and at bedtime.     Cyanocobalamin (VITAMIN B12 PO) Take 1 tablet by mouth daily.     ibuprofen (ADVIL) 200 MG tablet Take 200 mg by mouth every 6 (six) hours as needed for headache or moderate pain.     lisinopril (ZESTRIL) 20 MG tablet Take 1 tablet (20 mg total) by mouth daily. 90 tablet 3   MILK THISTLE PO Take 1 capsule by mouth in the morning and at bedtime.     Multiple Vitamin (MULTIVITAMIN WITH MINERALS) TABS tablet Take 1 tablet by mouth daily.     spironolactone (ALDACTONE) 25 MG tablet Take 1/2 tablet by mouth once a day. 90 tablet 3   No current facility-administered medications for this visit.    Allergies as of 10/31/2023 - Review Complete 10/31/2023  Allergen Reaction Noted   Dairy aid [tilactase]     Entresto [sacubitril-valsartan]  12/13/2019   Petrolatum-zinc oxide Rash 12/02/2015   Sulfa antibiotics Rash 05/07/2011   Sulfamethoxazole Rash 11/30/2011    Family History  Problem Relation Age of Onset   Hypertension Father    Heart disease Father    Heart attack Father    Hypertension Mother    Heart disease Mother    Heart attack Mother    Hypertension Brother    Prostate cancer Brother    Hypertension Maternal Grandmother    Colon cancer Neg Hx     Social History   Socioeconomic History   Marital status: Married    Spouse name: Not on file   Number of children: 3   Years of education: Not on file   Highest education level: Associate degree: academic program  Occupational History   Occupation: retired    Associate Professor: RETIRED  Tobacco Use   Smoking status: Never   Smokeless  tobacco: Never  Vaping Use   Vaping status: Never Used  Substance and Sexual Activity   Alcohol use: Yes    Alcohol/week: 14.0 standard drinks of alcohol    Types: 14 Glasses of wine per week    Comment: at least two glasses of wine sometimes more nightly   Drug use: No   Sexual activity: Not Currently    Partners: Male    Birth control/protection: Post-menopausal  Other Topics Concern   Not on file  Social History Narrative   Not on file   Social Drivers of Health   Financial Resource Strain: Low Risk  (03/19/2023)  Overall Financial Resource Strain (CARDIA)    Difficulty of Paying Living Expenses: Not hard at all  Food Insecurity: No Food Insecurity (03/19/2023)   Hunger Vital Sign    Worried About Running Out of Food in the Last Year: Never true    Ran Out of Food in the Last Year: Never true  Transportation Needs: No Transportation Needs (03/19/2023)   PRAPARE - Administrator, Civil Service (Medical): No    Lack of Transportation (Non-Medical): No  Physical Activity: Unknown (03/19/2023)   Exercise Vital Sign    Days of Exercise per Week: 0 days    Minutes of Exercise per Session: Not on file  Stress: Not on file  Social Connections: Moderately Isolated (03/19/2023)   Social Connection and Isolation Panel [NHANES]    Frequency of Communication with Friends and Family: More than three times a week    Frequency of Social Gatherings with Friends and Family: Once a week    Attends Religious Services: Never    Database administrator or Organizations: No    Attends Engineer, structural: Not on file    Marital Status: Married  Catering manager Violence: Not on file    Review of Systems:    Constitutional: No weight loss, fever, chills, weakness or fatigue HEENT: Eyes: No change in vision               Ears, Nose, Throat:  No change in hearing or congestion Skin: No rash or itching Cardiovascular: No chest pain, chest pressure or palpitations   Respiratory:  No SOB or cough Gastrointestinal: See HPI and otherwise negative Genitourinary: No dysuria or change in urinary frequency Neurological: No headache, dizziness or syncope Musculoskeletal: No new muscle or joint pain Hematologic: No bleeding or bruising Psychiatric: No history of depression or anxiety    Physical Exam:  Vital signs: BP 132/80 (BP Location: Left Arm, Patient Position: Sitting, Cuff Size: Normal)   Pulse 77   Ht 5\' 4"  (1.626 m)   Wt 195 lb (88.5 kg)   SpO2 (!) 79%   BMI 33.47 kg/m   Constitutional: NAD, Well developed, Well nourished, alert and cooperative Head:  Normocephalic and atraumatic. Eyes:   PEERL, EOMI. No icterus. Conjunctiva pink. Respiratory: Respirations even and unlabored. Lungs clear to auscultation bilaterally.   No wheezes, crackles, or rhonchi.  Cardiovascular:  Regular rate and rhythm. No peripheral edema, cyanosis or pallor.  Gastrointestinal:  Soft, nondistended, nontender. No rebound or guarding. Normal bowel sounds. No appreciable masses or hepatomegaly. Rectal:  Not performed.  Msk:  Symmetrical without gross deformities. Without edema, no deformity or joint abnormality.  Neurologic:  Alert and  oriented x4;  grossly normal neurologically.  Skin:   Dry and intact without significant lesions or rashes. Psychiatric: Oriented to person, place and time. Demonstrates good judgement and reason without abnormal affect or behaviors.   RELEVANT LABS AND IMAGING: CBC    Component Value Date/Time   WBC 7.6 06/21/2023 1524   RBC 4.66 06/21/2023 1524   HGB 14.5 06/21/2023 1524   HGB 14.9 12/10/2019 1456   HCT 43.4 06/21/2023 1524   HCT 43.7 12/10/2019 1456   PLT 128.0 (L) 06/21/2023 1524   PLT 144 (L) 12/10/2019 1456   MCV 93.1 06/21/2023 1524   MCV 92 12/10/2019 1456   MCH 31.2 12/10/2019 1456   MCHC 33.5 06/21/2023 1524   RDW 13.7 06/21/2023 1524   RDW 13.2 12/10/2019 1456   LYMPHSABS 2.9 06/21/2023 1524  MONOABS 0.5 06/21/2023 1524    EOSABS 0.2 06/21/2023 1524   BASOSABS 0.0 06/21/2023 1524    CMP     Component Value Date/Time   NA 138 06/21/2023 1524   NA 142 02/19/2022 1408   K 4.2 06/21/2023 1524   CL 102 06/21/2023 1524   CO2 28 06/21/2023 1524   GLUCOSE 84 06/21/2023 1524   BUN 15 06/21/2023 1524   BUN 17 02/19/2022 1408   CREATININE 0.61 06/21/2023 1524   CALCIUM 9.9 06/21/2023 1524   PROT 7.5 06/21/2023 1524   PROT 6.9 11/14/2019 1154   ALBUMIN 3.9 06/21/2023 1524   ALBUMIN 4.2 11/14/2019 1154   AST 49 (H) 06/21/2023 1524   ALT 43 (H) 06/21/2023 1524   ALKPHOS 110 06/21/2023 1524   BILITOT 0.6 06/21/2023 1524   BILITOT 0.5 11/14/2019 1154   GFRNONAA 90 12/20/2019 1105   GFRAA 103 12/20/2019 1105     Assessment/Plan:      Cirrhosis (likely secondary to NASH) MELD score 19 June 2023.Marland Kitchen Patient has a history of hepatitis in childhood and currently has concerns about the impact of Entresto on liver health. Patient has made lifestyle modifications to manage fatty liver and is aware of the need for alcohol cessation.  Declines EGD for varices today.  Declines hepatitis A/B vaccine.  MRI August 2024 with no lesions -CBC, CMP, PT/INR to calculate MELD - Repeat in 3 months - Follow-up office visit with Dr. Rhea Belton in 6 months --Next imaging (ultrasound) due in August 2025 as per protocol for cirrhosis patients to monitor for [facility] - She will let us know when she is ready to get EGD to screen for varices - Patient politely declined hepatitis A/B vaccine.  General Health Maintenance -Continue monitoring heart health with biennial echocardiogram due in the next couple of months. -Declined hepatitis vaccination.      Lara Mulch  Gastroenterology 10/31/2023, 12:59 PM  Cc: Myrlene Broker, *

## 2023-10-31 NOTE — Patient Instructions (Signed)
Your provider has requested that you go to the basement level for lab work before leaving today. Press "B" on the elevator. The lab is located at the first door on the left as you exit the elevator.  _______________________________________________________  If your blood pressure at your visit was 140/90 or greater, please contact your primary care physician to follow up on this.  _______________________________________________________  If you are age 79 or older, your body mass index should be between 23-30. Your Body mass index is 33.47 kg/m. If this is out of the aforementioned range listed, please consider follow up with your Primary Care Provider.  If you are age 55 or younger, your body mass index should be between 19-25. Your Body mass index is 33.47 kg/m. If this is out of the aformentioned range listed, please consider follow up with your Primary Care Provider.   ________________________________________________________  The [facility] providers would like to encourage you to use [facility] to communicate with providers for non-urgent requests or questions.  Due to long hold times on the telephone, sending your provider a message by [facility] may be a faster and more efficient way to get a response.  Please allow 48 business hours for a response.  Please remember that this is for non-urgent requests.  _______________________________________________________   It was a pleasure to see you today!  Thank you for trusting me with your gastrointestinal care!

## 2023-11-03 DIAGNOSIS — K080 Exfoliation of teeth due to systemic causes: Secondary | ICD-10-CM | POA: Diagnosis not present

## 2023-11-11 ENCOUNTER — Encounter: Payer: Self-pay | Admitting: Internal Medicine

## 2023-11-11 ENCOUNTER — Ambulatory Visit (INDEPENDENT_AMBULATORY_CARE_PROVIDER_SITE_OTHER): Payer: Medicare Other | Admitting: Internal Medicine

## 2023-11-11 VITALS — BP 140/76 | HR 74 | Temp 98.2°F | Ht 64.0 in | Wt 193.0 lb

## 2023-11-11 DIAGNOSIS — G8929 Other chronic pain: Secondary | ICD-10-CM | POA: Diagnosis not present

## 2023-11-11 DIAGNOSIS — M79642 Pain in left hand: Secondary | ICD-10-CM

## 2023-11-11 DIAGNOSIS — K7460 Unspecified cirrhosis of liver: Secondary | ICD-10-CM | POA: Diagnosis not present

## 2023-11-11 DIAGNOSIS — R21 Rash and other nonspecific skin eruption: Secondary | ICD-10-CM | POA: Diagnosis not present

## 2023-11-11 DIAGNOSIS — M545 Low back pain, unspecified: Secondary | ICD-10-CM | POA: Diagnosis not present

## 2023-11-11 DIAGNOSIS — M79641 Pain in right hand: Secondary | ICD-10-CM | POA: Diagnosis not present

## 2023-11-11 MED ORDER — METRONIDAZOLE 0.75 % EX LOTN: TOPICAL_LOTION | CUTANEOUS | 6 refills | Status: AC

## 2023-11-11 MED ORDER — PREDNISONE 20 MG PO TABS: 40.0000 mg | ORAL_TABLET | Freq: Every day | ORAL | 0 refills | Status: AC

## 2023-11-11 NOTE — Assessment & Plan Note (Signed)
With pain and swelling. Unable to use well. Rx prednisone 5 day course to see if this responds.

## 2023-11-11 NOTE — Patient Instructions (Signed)
We have sent in the prednisone to take and the metronidazole cream for the face.

## 2023-11-11 NOTE — Assessment & Plan Note (Signed)
Acute on chronic with recent fall. Rx prednisone to help with acute flare of pain. Imaging with ortho and no new fracture or disc issues.

## 2023-11-11 NOTE — Assessment & Plan Note (Signed)
Needs rx metronidazole 0.75% for face done for likely rosacea.

## 2023-11-11 NOTE — Progress Notes (Signed)
   Subjective:   Patient ID: Madison Gutierrez, female    DOB: 07-12-1946, 78 y.o.   MRN: [id number]  HPI The patient is a 78 YO female coming in for back pain after injury and she has seen orthopedics no fracture. Taking ibuprofen 800 mg TID as needed. Overall improving.  Review of Systems  Constitutional:  Positive for activity change. Negative for appetite change, chills, fatigue, fever and unexpected weight change.  HENT: Negative.    Eyes: Negative.   Respiratory: Negative.  Negative for cough, chest tightness and shortness of breath.   Cardiovascular: Negative.  Negative for chest pain, palpitations and leg swelling.  Gastrointestinal: Negative.  Negative for abdominal distention, abdominal pain, constipation, diarrhea, nausea and vomiting.  Musculoskeletal:  Positive for arthralgias, back pain and myalgias. Negative for gait problem and joint swelling.  Skin: Negative.   Neurological: Negative.   Psychiatric/Behavioral: Negative.      Objective:  Physical Exam Constitutional:      Appearance: She is well-developed.  HENT:     Head: Normocephalic and atraumatic.  Cardiovascular:     Rate and Rhythm: Normal rate and regular rhythm.  Pulmonary:     Effort: Pulmonary effort is normal. No respiratory distress.     Breath sounds: Normal breath sounds. No wheezing or rales.  Abdominal:     General: Bowel sounds are normal. There is no distension.     Palpations: Abdomen is soft.     Tenderness: There is no abdominal tenderness. There is no rebound.  Musculoskeletal:        General: Tenderness present.     Cervical back: Normal range of motion.  Skin:    General: Skin is warm and dry.  Neurological:     Mental Status: She is alert and oriented to person, place, and time.     Coordination: Coordination normal.     Vitals:   11/11/23 1129  BP: (!) 140/76  Pulse: 74  Temp: 98.2 F (36.8 C)  TempSrc: Oral  SpO2: 98%  Weight: 193 lb (87.5 kg)  Height: 5\' 4"  (1.626 m)     Assessment & Plan:  Visit time 25 minutes in face to face communication with patient and coordination of care, additional 5 minutes spent in record review, coordination or care, ordering tests, communicating/referring to other healthcare professionals, documenting in medical records all on the same day of the visit for total time 30 minutes spent on the visit.

## 2023-11-11 NOTE — Assessment & Plan Note (Signed)
Recent labs with GI reviewed and slightly improved.

## 2023-11-14 DIAGNOSIS — H5203 Hypermetropia, bilateral: Secondary | ICD-10-CM | POA: Diagnosis not present

## 2023-11-23 DIAGNOSIS — K080 Exfoliation of teeth due to systemic causes: Secondary | ICD-10-CM | POA: Diagnosis not present

## 2023-12-27 ENCOUNTER — Other Ambulatory Visit: Payer: Self-pay | Admitting: Internal Medicine

## 2024-02-06 ENCOUNTER — Ambulatory Visit (INDEPENDENT_AMBULATORY_CARE_PROVIDER_SITE_OTHER)

## 2024-02-06 VITALS — Ht 64.0 in | Wt 193.0 lb

## 2024-02-06 DIAGNOSIS — Z780 Asymptomatic menopausal state: Secondary | ICD-10-CM

## 2024-02-06 DIAGNOSIS — Z0000 Encounter for general adult medical examination without abnormal findings: Secondary | ICD-10-CM | POA: Diagnosis not present

## 2024-02-06 DIAGNOSIS — Z1231 Encounter for screening mammogram for malignant neoplasm of breast: Secondary | ICD-10-CM

## 2024-02-06 NOTE — Patient Instructions (Signed)
 Madison Gutierrez , Thank you for taking time to come for your Medicare Wellness Visit. I appreciate your ongoing commitment to your health goals. Please review the following plan we discussed and let me know if I can assist you in the future.   Referrals/Orders/Follow-Ups/Clinician Recommendations: You are due for a mammogram and a Bone density screening.  Each day, aim for 6 glasses of water, plenty of protein in your diet and try to get up and walk/ stretch every hour for 5-10 minutes at a time.  You have an order for:   [x]   3D Mammogram  [x]   Bone Density     Please call for appointment:  [facility] [address] [place], [place] [address] [phone]  [facility] [address] [place], [place] [address] [phone]  [facility] [address] [place], [place] [address] [phone]  [facility] [address] [place], [place] [address] [phone]  [facility] [address] [place], [place] [address] [phone]    Make sure to wear two-piece clothing.  No lotions, powders, or deodorants the day of the appointment. Make sure to bring picture ID and insurance card.  Bring list of medications you are currently taking including any supplements.    This is a list of the screening recommended for you and due dates:  Health Maintenance  Topic Date Due   Hepatitis C Screening  Never done   DTaP/Tdap/Td vaccine (1 - Tdap) Never done   Zoster (Shingles) Vaccine (1 of 2) Never done   Pneumonia Vaccine (2 of 2 - PCV) 05/18/2012   Colon Cancer Screening  11/22/2021   COVID-19 Vaccine (2 - 2024-25 season) 06/12/2023   Medicare Annual Wellness Visit  12/17/2023   Flu Shot  05/11/2024   DEXA scan (bone density measurement)  Completed   HPV Vaccine  Aged Out   Meningitis B Vaccine  Aged Out    Advanced directives: (Copy Requested) Please  bring a copy of your health care power of attorney and living will to the office to be added to your chart at your convenience. You can mail to [facility] [address] [place], [place] [address] or email to [email or web address]  Next Medicare Annual Wellness Visit scheduled for next year: Yes

## 2024-02-06 NOTE — Progress Notes (Signed)
 Subjective:   Madison Gutierrez is a 78 y.o. who presents for a Medicare Wellness preventive visit.  Visit Complete: Virtual I connected with  Madison Gutierrez on 02/06/24 by a audio enabled telemedicine application and verified that I am speaking with the correct person using two identifiers.  Patient Location: Home  Provider Location: Home Office  I discussed the limitations of evaluation and management by telemedicine. The patient expressed understanding and agreed to proceed.  Vital Signs: Because this visit was a virtual/telehealth visit, some criteria may be missing or patient reported. Any vitals not documented were not able to be obtained and vitals that have been documented are patient reported.  VideoDeclined- This patient declined Librarian, academic. Therefore the visit was completed with audio only.  Persons Participating in Visit: Patient.  AWV Questionnaire: No: Patient Medicare AWV questionnaire was not completed prior to this visit.  Cardiac Risk Factors include: advanced age (>85men, >37 women);dyslipidemia;obesity (BMI >30kg/m2)     Objective:    Today's Vitals   02/06/24 1339  Weight: 193 lb (87.5 kg)  Height: 5\' 4"  (1.626 m)   Body mass index is 33.13 kg/m.     02/06/2024    1:47 PM 12/29/2018    9:18 AM  Advanced Directives  Does Patient Have a Medical Advance Directive? Yes No  Type of Estate agent of [place];Living will   Does patient want to make changes to medical advance directive? No - Patient declined   Copy of Healthcare Power of Attorney in Chart? Yes - validated most recent copy scanned in chart (See row information)   Would patient like information on creating a medical advance directive?  No - Patient declined    Current Medications (verified) Outpatient Encounter Medications as of 02/06/2024  Medication Sig   Calcium  Carb-Cholecalciferol (CALCIUM  1000 + D PO) Take 1 tablet by mouth in the  morning and at bedtime.   Cholecalciferol (VITAMIN D ) 125 MCG (5000 UT) CAPS Take by mouth daily.   Choline Bitartrate CRYS Take 650 mg by mouth in the morning and at bedtime.   Coenzyme Q10 (COQ10) 200 MG CAPS Take 200 mg by mouth daily.   [employer] CINNAMON PO Take 6,000 mg by mouth in the morning and at bedtime.   Cyanocobalamin (VITAMIN B12 PO) Take 1 tablet by mouth daily.   ibuprofen (ADVIL) 200 MG tablet Take 200 mg by mouth every 6 (six) hours as needed for headache or moderate pain.   lisinopril  (ZESTRIL ) 20 MG tablet Take 1 tablet by mouth once daily   METRONIDAZOLE , TOPICAL, 0.75 % LOTN Apply BID   MILK THISTLE PO Take 1 capsule by mouth in the morning and at bedtime.   Multiple Vitamin (MULTIVITAMIN WITH MINERALS) TABS tablet Take 1 tablet by mouth daily.   predniSONE  (DELTASONE ) 20 MG tablet Take 2 tablets (40 mg total) by mouth daily with breakfast.   spironolactone  (ALDACTONE ) 25 MG tablet Take 1/2 tablet by mouth once a day.   No facility-administered encounter medications on file as of 02/06/2024.    Allergies (verified) Dairy aid [tilactase], Entresto  [sacubitril-valsartan], Petrolatum-zinc oxide, Sulfa antibiotics, and Sulfamethoxazole   History: Past Medical History:  Diagnosis Date   Abnormal liver function test    Asthma    mild asthma from dairy allergies   Elevated LFTs 05/19/11   Frequent PVCs    GERD (gastroesophageal reflux disease)    Hepatic steatosis 05/24/11   Hepatitis, unspecified    Hypertension  Mild aortic stenosis    Mild CAD    a. by cath 12/2018.   Obesity    Osteopenia 05/2011   t score -1.9   Psoriasis    Rheumatoid arthritis(714.0)    Staph aureus infection 2007   rt. shin   SVT (supraventricular tachycardia) (HCC)    Ulcer    Past Surgical History:  Procedure Laterality Date   APPENDECTOMY  1998   FACIAL RECONSTRUCTION SURGERY     HERNIA REPAIR  2003   bi-lat  repair    OVARIAN CYST REMOVAL  1978   from left ovary    RIGHT/LEFT  HEART CATH AND CORONARY ANGIOGRAPHY N/A 12/29/2018   Procedure: RIGHT/LEFT HEART CATH AND CORONARY ANGIOGRAPHY;  Surgeon: Odie Benne, MD;  Location: [facility];  Service: Cardiovascular;  Laterality: N/A;   tummy tuck  2003   Family History  Problem Relation Age of Onset   Hypertension Father    Heart disease Father    Heart attack Father    Hypertension Mother    Heart disease Mother    Heart attack Mother    Hypertension Brother    Prostate cancer Brother    Hypertension Maternal Grandmother    Colon cancer Neg Hx    Social History   Socioeconomic History   Marital status: Married    Spouse name: Aron Lard   Number of children: 3   Years of education: Not on file   Highest education level: Associate degree: occupational, Scientist, product/process development, or vocational program  Occupational History   Occupation: retired    Associate Professor: RETIRED  Tobacco Use   Smoking status: Never   Smokeless tobacco: Never  Vaping Use   Vaping status: Never Used  Substance and Sexual Activity   Alcohol use: Yes    Alcohol/week: 14.0 standard drinks of alcohol    Types: 14 Glasses of wine per week    Comment: at least two glasses of wine sometimes more nightly   Drug use: No   Sexual activity: Not Currently    Partners: Male    Birth control/protection: Post-menopausal  Other Topics Concern   Not on file  Social History Narrative   Lives with husband and daughter and 1 cat.   Social Drivers of Corporate investment banker Strain: Low Risk  (02/06/2024)   Overall Financial Resource Strain (CARDIA)    Difficulty of Paying Living Expenses: Not very hard  Food Insecurity: No Food Insecurity (02/06/2024)   Hunger Vital Sign    Worried About Running Out of Food in the Last Year: Never true    Ran Out of Food in the Last Year: Never true  Transportation Needs: No Transportation Needs (02/06/2024)   PRAPARE - Administrator, Civil Service (Medical): No    Lack of Transportation  (Non-Medical): No  Physical Activity: Sufficiently Active (02/06/2024)   Exercise Vital Sign    Days of Exercise per Week: 7 days    Minutes of Exercise per Session: 60 min  Stress: No Stress Concern Present (02/06/2024)   [employer] of Occupational Health - Occupational Stress Questionnaire    Feeling of Stress : Not at all  Social Connections: Moderately Isolated (02/06/2024)   Social Connection and Isolation Panel [NHANES]    Frequency of Communication with Friends and Family: Twice a week    Frequency of Social Gatherings with Friends and Family: Once a week    Attends Religious Services: Never    Database administrator or Organizations:  No    Attends Banker Meetings: Never    Marital Status: Married    Tobacco Counseling Counseling given: Not Answered    Clinical Intake:  Pre-visit preparation completed: Yes  Pain : No/denies pain     BMI - recorded: 33.13 Nutritional Status: BMI > 30  Obese Nutritional Risks: None Diabetes: No  Lab Results  Component Value Date   HGBA1C 5.4 12/17/2022   HGBA1C 5.6 04/06/2021   HGBA1C 5.4 10/20/2017     How often do you need to have someone help you when you read instructions, pamphlets, or other written materials from your doctor or pharmacy?: 1 - Never  Interpreter Needed?: No  Information entered by :: Tynika Luddy, RMA   Activities of Daily Living     02/06/2024    1:43 PM  In your present state of health, do you have any difficulty performing the following activities:  Hearing? 1  Comment per pt-hard of hearing  Vision? 0  Difficulty concentrating or making decisions? 0  Walking or climbing stairs? 0  Dressing or bathing? 0  Doing errands, shopping? 0  Preparing Food and eating ? N  Using the Toilet? N  In the past six months, have you accidently leaked urine? Y  Do you have problems with loss of bowel control? N  Managing your Medications? N  Managing your Finances? N  Housekeeping or  managing your Housekeeping? N    Patient Care Team: Adelia Homestead, MD as PCP - General (Internal Medicine) Odie Benne, MD as PCP - Cardiology (Cardiology) Corie Diamond, MD as Attending Physician (Ophthalmology)  Indicate any recent Medical Services you may have received from other than [facility] providers in the past year (date may be approximate).     Assessment:   This is a routine wellness examination for Taunja.  Hearing/Vision screen Hearing Screening - Comments:: per pt-hard of hearing Vision Screening - Comments:: Wears eyeglasses   Goals Addressed               This Visit's Progress     Patient Stated (pt-stated)        Would like to have good health.  Would like to loose more weight.       Depression Screen     02/06/2024    1:51 PM 12/17/2022   11:19 AM 04/06/2021    1:27 PM 10/07/2016   11:08 AM  PHQ 2/9 Scores  PHQ - 2 Score 0 0 0 0  PHQ- 9 Score 1 6      Fall Risk     02/06/2024    1:46 PM 12/17/2022   11:18 AM 04/06/2021    1:26 PM 10/07/2016   11:08 AM  Fall Risk   Falls in the past year? 1 0 0 No  Number falls in past yr: 0 0 0   Injury with Fall? 1 0 0   Risk for fall due to : Impaired balance/gait     Follow up Falls evaluation completed;Falls prevention discussed Falls evaluation completed      MEDICARE RISK AT HOME:  Medicare Risk at Home Any stairs in or around the home?: Yes (2 story home) If so, are there any without handrails?: Yes Home free of loose throw rugs in walkways, pet beds, electrical cords, etc?: Yes Adequate lighting in your home to reduce risk of falls?: Yes Life alert?: No Use of a cane, walker or w/c?: No Grab bars in the bathroom?: Yes Shower chair or bench  in shower?: No Elevated toilet seat or a handicapped toilet?: Yes  TIMED UP AND GO:  Was the test performed?  No  Cognitive Function: Declined/Normal: No cognitive concerns noted by patient or family. Patient alert, oriented, able to answer  questions appropriately and recall recent events. No signs of memory loss or confusion.        Immunizations Immunization History  Administered Date(s) Administered   Comptroller (J&J) SARS-COV-2 Vaccination 03/15/2020   Pneumococcal Polysaccharide-23 12/03/2005, 05/19/2011    Screening Tests Health Maintenance  Topic Date Due   Hepatitis C Screening  Never done   DTaP/Tdap/Td (1 - Tdap) Never done   Zoster Vaccines- Shingrix (1 of 2) Never done   Pneumonia Vaccine 99+ Years old (2 of 2 - PCV) 05/18/2012   Colonoscopy  11/22/2021   COVID-19 Vaccine (2 - 2024-25 season) 06/12/2023   Medicare Annual Wellness (AWV)  12/17/2023   INFLUENZA VACCINE  05/11/2024   DEXA SCAN  Completed   HPV VACCINES  Aged Out   Meningococcal B Vaccine  Aged Out    Health Maintenance  Health Maintenance Due  Topic Date Due   Hepatitis C Screening  Never done   DTaP/Tdap/Td (1 - Tdap) Never done   Zoster Vaccines- Shingrix (1 of 2) Never done   Pneumonia Vaccine 80+ Years old (2 of 2 - PCV) 05/18/2012   Colonoscopy  11/22/2021   COVID-19 Vaccine (2 - 2024-25 season) 06/12/2023   Medicare Annual Wellness (AWV)  12/17/2023   Health Maintenance Items Addressed: Mammogram ordered, DEXA ordered, See Nurse Notes  Additional Screening:  Vision Screening: Recommended annual ophthalmology exams for early detection of glaucoma and other disorders of the eye.  Dental Screening: Recommended annual dental exams for proper oral hygiene  Community Resource Referral / Chronic Care Management: CRR required this visit?  No   CCM required this visit?  No     Plan:     I have personally reviewed and noted the following in the patient's chart:   Medical and social history Use of alcohol, tobacco or illicit drugs  Current medications and supplements including opioid prescriptions. Patient is not currently taking opioid prescriptions. Functional ability and status Nutritional status Physical  activity Advanced directives List of other physicians Hospitalizations, surgeries, and ER visits in previous 12 months Vitals Screenings to include cognitive, depression, and falls Referrals and appointments  In addition, I have reviewed and discussed with patient certain preventive protocols, quality metrics, and best practice recommendations. A written personalized care plan for preventive services as well as general preventive health recommendations were provided to patient.     Tyanne Derocher L Lewanda Perea, CMA   02/06/2024   After Visit Summary: (MyChart) Due to this being a telephonic visit, the after visit summary with patients personalized plan was offered to patient via MyChart   Notes: Please refer to Routing Comments.

## 2024-02-08 DIAGNOSIS — L239 Allergic contact dermatitis, unspecified cause: Secondary | ICD-10-CM | POA: Diagnosis not present

## 2024-02-08 DIAGNOSIS — L408 Other psoriasis: Secondary | ICD-10-CM | POA: Diagnosis not present

## 2024-02-24 ENCOUNTER — Encounter: Payer: Self-pay | Admitting: Internal Medicine

## 2024-02-27 ENCOUNTER — Other Ambulatory Visit: Payer: Self-pay

## 2024-02-27 ENCOUNTER — Telehealth: Payer: Self-pay | Admitting: Gastroenterology

## 2024-02-27 DIAGNOSIS — R748 Abnormal levels of other serum enzymes: Secondary | ICD-10-CM

## 2024-02-27 DIAGNOSIS — K7460 Unspecified cirrhosis of liver: Secondary | ICD-10-CM

## 2024-02-27 NOTE — Telephone Encounter (Signed)
 Inbound call from patient, would like to come in Thursday for 109m recheck labs for liver enzymes. Orders are not in yet, patient would like them to be placed.

## 2024-02-27 NOTE — Telephone Encounter (Signed)
 Lab orders placed as requested.

## 2024-03-01 ENCOUNTER — Other Ambulatory Visit (INDEPENDENT_AMBULATORY_CARE_PROVIDER_SITE_OTHER)

## 2024-03-01 ENCOUNTER — Ambulatory Visit (INDEPENDENT_AMBULATORY_CARE_PROVIDER_SITE_OTHER)
Admission: RE | Admit: 2024-03-01 | Discharge: 2024-03-01 | Disposition: A | Discharge status: Home / Self Care | Source: Ambulatory Visit | Attending: Internal Medicine | Admitting: Internal Medicine

## 2024-03-01 DIAGNOSIS — Z780 Asymptomatic menopausal state: Secondary | ICD-10-CM | POA: Diagnosis not present

## 2024-03-01 DIAGNOSIS — K7460 Unspecified cirrhosis of liver: Secondary | ICD-10-CM

## 2024-03-01 DIAGNOSIS — R748 Abnormal levels of other serum enzymes: Secondary | ICD-10-CM | POA: Diagnosis not present

## 2024-03-01 LAB — CBC WITH DIFFERENTIAL/PLATELET
Basophils Absolute: 0.1 10*3/uL (ref 0.0–0.1)
Basophils Relative: 1 % (ref 0.0–3.0)
Eosinophils Absolute: 0.2 10*3/uL (ref 0.0–0.7)
Eosinophils Relative: 3.5 % (ref 0.0–5.0)
HCT: 43.2 % (ref 36.0–46.0)
Hemoglobin: 14.3 g/dL (ref 12.0–15.0)
Lymphocytes Relative: 35.2 % (ref 12.0–46.0)
Lymphs Abs: 2.2 10*3/uL (ref 0.7–4.0)
MCHC: 33.2 g/dL (ref 30.0–36.0)
MCV: 90.3 fl (ref 78.0–100.0)
Monocytes Absolute: 0.5 10*3/uL (ref 0.1–1.0)
Monocytes Relative: 7.3 % (ref 3.0–12.0)
Neutro Abs: 3.3 10*3/uL (ref 1.4–7.7)
Neutrophils Relative %: 53 % (ref 43.0–77.0)
Platelets: 107 10*3/uL — ABNORMAL LOW (ref 150.0–400.0)
RBC: 4.79 Mil/uL (ref 3.87–5.11)
RDW: 14.9 % (ref 11.5–15.5)
WBC: 6.2 10*3/uL (ref 4.0–10.5)

## 2024-03-01 LAB — COMPREHENSIVE METABOLIC PANEL WITH GFR
ALT: 31 U/L (ref 0–35)
AST: 33 U/L (ref 0–37)
Albumin: 4.1 g/dL (ref 3.5–5.2)
Alkaline Phosphatase: 90 U/L (ref 39–117)
BUN: 16 mg/dL (ref 6–23)
CO2: 29 meq/L (ref 19–32)
Calcium: 9.3 mg/dL (ref 8.4–10.5)
Chloride: 102 meq/L (ref 96–112)
Creatinine, Ser: 0.66 mg/dL (ref 0.40–1.20)
GFR: 83.81 mL/min (ref >60.00)
Glucose, Bld: 86 mg/dL (ref 70–99)
Potassium: 4.1 meq/L (ref 3.5–5.1)
Sodium: 138 meq/L (ref 135–145)
Total Bilirubin: 0.5 mg/dL (ref 0.2–1.2)
Total Protein: 7.1 g/dL (ref 6.0–8.3)

## 2024-03-01 LAB — PROTIME-INR
INR: 1.2 ratio — ABNORMAL HIGH (ref 0.8–1.0)
Prothrombin Time: 12.4 s (ref 9.6–13.1)

## 2024-03-02 ENCOUNTER — Ambulatory Visit: Payer: Self-pay | Admitting: Gastroenterology

## 2024-03-06 ENCOUNTER — Ambulatory Visit: Payer: Self-pay | Admitting: Internal Medicine

## 2024-04-30 ENCOUNTER — Other Ambulatory Visit: Payer: Self-pay | Admitting: Internal Medicine

## 2024-05-03 DIAGNOSIS — K080 Exfoliation of teeth due to systemic causes: Secondary | ICD-10-CM | POA: Diagnosis not present

## 2024-05-07 ENCOUNTER — Other Ambulatory Visit: Payer: Self-pay | Admitting: Physician Assistant

## 2024-06-01 ENCOUNTER — Encounter: Payer: Self-pay | Admitting: Cardiovascular Disease

## 2024-06-01 DIAGNOSIS — I428 Other cardiomyopathies: Secondary | ICD-10-CM

## 2024-06-05 ENCOUNTER — Other Ambulatory Visit: Payer: Self-pay | Admitting: Physician Assistant

## 2024-06-12 ENCOUNTER — Other Ambulatory Visit: Payer: Self-pay

## 2024-07-03 ENCOUNTER — Ambulatory Visit (HOSPITAL_COMMUNITY)
Admission: RE | Admit: 2024-07-03 | Discharge: 2024-07-03 | Disposition: A | Discharge status: Home / Self Care | Source: Ambulatory Visit | Attending: Internal Medicine | Admitting: Internal Medicine

## 2024-07-03 DIAGNOSIS — I428 Other cardiomyopathies: Secondary | ICD-10-CM | POA: Diagnosis not present

## 2024-07-03 LAB — ECHOCARDIOGRAM COMPLETE
AR max vel: 1.64 cm2
AV Area VTI: 1.43 cm2
AV Area mean vel: 1.64 cm2
AV Mean grad: 16 mmHg
AV Peak grad: 21 mmHg
Ao pk vel: 2.29 m/s
S' Lateral: 3.09 cm

## 2024-07-04 ENCOUNTER — Ambulatory Visit: Payer: Self-pay | Admitting: Cardiovascular Disease

## 2024-07-09 MED ORDER — SPIRONOLACTONE 25 MG PO TABS: ORAL_TABLET | ORAL | 1 refills | Status: AC

## 2024-07-26 DIAGNOSIS — M25551 Pain in right hip: Secondary | ICD-10-CM | POA: Diagnosis not present

## 2024-07-26 DIAGNOSIS — M545 Low back pain, unspecified: Secondary | ICD-10-CM | POA: Diagnosis not present

## 2024-07-28 ENCOUNTER — Other Ambulatory Visit: Payer: Self-pay | Admitting: Internal Medicine

## 2024-08-01 ENCOUNTER — Encounter: Payer: Self-pay | Admitting: *Deleted

## 2024-08-01 NOTE — Progress Notes (Signed)
 Madison Gutierrez                                          MRN: [id number]   08/01/2024   The VBCI Quality Team Specialist reviewed this patient medical record for the purposes of chart review for care gap closure. The following were reviewed: abstraction for care gap closure-controlling blood pressure.    VBCI Quality Team

## 2024-08-03 ENCOUNTER — Other Ambulatory Visit: Payer: Self-pay | Admitting: Internal Medicine

## 2024-09-04 ENCOUNTER — Encounter: Payer: Self-pay | Admitting: Physician Assistant

## 2024-09-18 NOTE — Progress Notes (Unsigned)
 Cardiology Office Note:  .   Date:  09/18/2024  ID:  Madison Gutierrez, DOB Aug 19, 1946, MRN [id number] PCP: Rollene Almarie LABOR, MD  [facility] Providers Cardiologist:  Lonni Cash, MD {   History of Present Illness: .   Madison Gutierrez is a 78 y.o. female with past medical history of hypertension, aortic stenosis, chronic LBBB, mild MR, mild CAD, nonischemic cardiomyopathy and SVT here for cardiac follow-up.  Patient was seen back by Dr. Cash August 2012 for evaluation of palpitations.  Her EKG primarily showed PVCs.  Arrange an echo and a 48-hour Holter monitor August 2012.  Echo showed normal LV size and function with EF of 70%.  There was mild aortic valve disease with mild stenosis and regurgitation.  Her Holter monitor showed normal sinus rhythm at baseline with frequent PVCs and several runs of SVT (longest 104 beats at 177 bpm).  Which she was started then on Cardizem .  Echo March 2018 with LVEF  60 to 65%, aortic valve sclerosis with mild aortic stenosis.  Seen in the office by Dr. Cash 11/13/2018 with complaint of chest pressure and dyspnea on exertion.  Echo 11/24/2018 with LVEF 35 to 40% with severe hypokinesis of the inferior septal and anterior septal walls.  Image quality was poor.  Aortic leaflets were thickened with restricted opening and mean gradient of 14 mmHg consistent with mild aortic stenosis.  Mild MR.  Underwent nuclear stress test 11/24/2018 without any large areas of ischemia.  Cardiac cath 12/29/2018 showed mild nonobstructive CAD and mild aortic stenosis with normal LV systolic function.  LVEDP was normal.  She was seen by Dayna Dunn, PA 11/14/2019 reported worsening dyspnea on exertion and a gradual continue home from the year prior.  Blood pressure was not well-controlled.  She also noted frequent, daily chest aching unchanged ever since before her catheterization, not anginal or exertional in nature.  She also reported episodic wheezing.  She had gained  weight but patient strongly felt that this body weight gain rather than fluid weight gain.  Echo 11/26/2019 showed EF 40% with global hypokinesis with septal lateral dyssynchrony consistent with LBBB, grade 1 DD, mild MR, mild dilation of aortic root.  Attempted to change low-dose lisinopril  to Entresto .  She did not tolerate Entresto  at this time.  Aldactone  was added.  She increased her lisinopril  dose and stopped metoprolol  succinate and her dyspnea improved.  Cardiac MRI April 2021 with LVEF 42% and global hypokinesis with asymmetric septal hypertrophy with no SAM or LVOT obstruction.  Suggestive of hypertensive heart disease.  No findings to suggest an infiltrative process.  Echo February 2023 with LVEF 45 to 50%, mild MR, mild AAS (mean gradient 60 mmHg).  She was last seen by Dr. Cash 02/12/2022 and at that time was doing well without any chest pain, dyspnea, palpitations, lower extremity edema, orthopnea, PND, dizziness, near-syncope, or syncope.  Blood pressure had been elevated at home.  She had restarted Entresto  at home the previous week.  Lisinopril  was discontinued at that time and she was encouraged to continue Entresto  and Aldactone .  BMP in a week to recheck kidney function.  I saw her 03/2023, this is the first time I am meeting this patient.  She is very upset because she was taking lisinopril  and Entresto  at the same time and she felt very poorly.  She was having dizziness lightheadedness which was likely due to her blood pressure being too low.  She tells me that Entresto  contributed to her  cirrhosis of the liver which was recently found on ultrasound through her primary Dr. Rollene.  I did reach out to our Pharm.D. on staff today and there have been no reported incidences of Entresto  causing cirrhosis of the liver.  However, patient has had liver issues in the past.  She had hepatitis as a child and has been dealing with fatty liver for years.  Unfortunately, this has progressed to  cirrhosis and now she is requiring a MRI of the liver which is scheduled.  Her LFTs increased significantly on Entresto  and currently she is not taking this medication and is back on her lisinopril .  Blood pressure is well-controlled today.  LFTs are trending downward after stopping Entresto .  She has been referred to a GI specialist for further treatment.  She does chair yoga which she finds great joy in.  Otherwise, doing well.  Reports no shortness of breath nor dyspnea on exertion. Reports no chest pain, pressure, or tightness. No edema, orthopnea, PND. Reports no palpitations.   Today, she ***  ROS: Pertinent ROS located in HPI  Studies Reviewed: .        None reviewed today  No BP recorded.  {Refresh Note OR Click here to enter BP  :1}***       Physical Exam:   VS:  There were no vitals taken for this visit.   Wt Readings from Last 3 Encounters:  02/06/24 193 lb (87.5 kg)  11/11/23 193 lb (87.5 kg)  10/31/23 195 lb (88.5 kg)    GEN: Well nourished, well developed in no acute distress NECK: No JVD; No carotid bruits CARDIAC: RRR, + systolic murmurs, rubs, gallops RESPIRATORY:  Clear to auscultation without rales, wheezing or rhonchi  ABDOMEN: Soft, non-tender, non-distended EXTREMITIES:  No edema; No deformity   ASSESSMENT AND PLAN: .   1.  Chronic combined systolic and diastolic CHF -Patient upset because she was on lisinopril  and Entresto  at the same time which should have never happened. -She is also upset because of recent diagnosis of liver cirrhosis -Would continue current medication regimen with lisinopril  20 mg daily -Continue spironolactone  12.5 mg daily -She prefers a holistic approach to her health  2.  Nonischemic cardiomyopathy -LVEF 45 to 50% by echocardiogram February 2023.  Cardiac MRI April 2021 with LVEF 42% with global hypokinesis with asymmetric septal hypertrophy with no SAM or LVOT obstruction.  Suggestive of hypertensive heart disease. -Continue  lisinopril , blood pressure well-controlled today  3.  LBBB -Chronic and stable  4.  Aortic valve stenosis -Murmur present today which is chronic -Mild by echocardiogram February 2023. -Repeat echocardiogram February 2025  5.  Hypertension -Blood pressure well-controlled today -Continue current dose of lisinopril  -Continue low-sodium, heart healthy diet -Continue to monitor blood pressure at home  6.  SVT -No palpitations mentioned today  7.  CAD without angina -No chest pain mention today -Prefers not to make any medication changes at this time      Dispo: Patient plans to call and make an appointment  Signed, Orren LOISE Fabry, PA-C

## 2024-09-20 ENCOUNTER — Ambulatory Visit: Attending: Physician Assistant | Admitting: Physician Assistant

## 2024-09-20 ENCOUNTER — Encounter: Payer: Self-pay | Admitting: Physician Assistant

## 2024-09-20 VITALS — BP 158/80 | HR 80 | Ht 64.0 in | Wt 184.4 lb

## 2024-09-20 DIAGNOSIS — I2510 Atherosclerotic heart disease of native coronary artery without angina pectoris: Secondary | ICD-10-CM

## 2024-09-20 DIAGNOSIS — I471 Supraventricular tachycardia, unspecified: Secondary | ICD-10-CM | POA: Diagnosis not present

## 2024-09-20 DIAGNOSIS — Z131 Encounter for screening for diabetes mellitus: Secondary | ICD-10-CM | POA: Diagnosis not present

## 2024-09-20 DIAGNOSIS — I350 Nonrheumatic aortic (valve) stenosis: Secondary | ICD-10-CM

## 2024-09-20 DIAGNOSIS — I428 Other cardiomyopathies: Secondary | ICD-10-CM | POA: Diagnosis not present

## 2024-09-20 DIAGNOSIS — I10 Essential (primary) hypertension: Secondary | ICD-10-CM | POA: Diagnosis not present

## 2024-09-20 DIAGNOSIS — I5042 Chronic combined systolic (congestive) and diastolic (congestive) heart failure: Secondary | ICD-10-CM | POA: Diagnosis not present

## 2024-09-20 DIAGNOSIS — E782 Mixed hyperlipidemia: Secondary | ICD-10-CM

## 2024-09-20 MED ORDER — LISINOPRIL 20 MG PO TABS: 20.0000 mg | ORAL_TABLET | Freq: Every day | ORAL | 0 refills | Status: AC

## 2024-09-20 NOTE — Patient Instructions (Signed)
 Thank you for choosing [facility]!     Medication Instructions:  No medication changes were made during today's visit.  *If you need a refill on your cardiac medications before your next appointment, please call your pharmacy*   Lab Work: Return for fasting labs............... CMET, LIPID, A1C If you have labs (blood work) drawn today and your tests are completely normal, you will receive your results only by: MyChart Message (if you have MyChart) OR A paper copy in the mail If you have any lab test that is abnormal or we need to change your treatment, we will call you to review the results.   Testing/Procedures: No procedures were ordered during today's visit.   Your next appointment:   1 year(s)   Provider:   Orren Fabry, PA-C           Follow-Up: At [facility], you and your health needs are our priority.  As part of our continuing mission to provide you with exceptional heart care, we have created designated Provider Care Teams.  These Care Teams include your primary Cardiologist (physician) and Advanced Practice Providers (APPs -  Physician Assistants and Nurse Practitioners) who all work together to provide you with the care you need, when you need it. We recommend signing up for the patient portal called MyChart.  Sign up information is provided on this After Visit Summary.  MyChart is used to connect with patients for Virtual Visits (Telemedicine).  Patients are able to view lab/test results, encounter notes, upcoming appointments, etc.  Non-urgent messages can be sent to your provider as well.   To learn more about what you can do with MyChart, go to [email or web address].

## 2024-10-30 NOTE — Progress Notes (Signed)
 Madison Gutierrez                                          MRN: [id number]   10/30/2024   The VBCI Quality Team Specialist reviewed this patient medical record for the purposes of chart review for care gap closure. The following were reviewed: chart review for care gap closure-controlling blood pressure.    VBCI Quality Team

## 2025-02-06 ENCOUNTER — Ambulatory Visit
